# Patient Record
Sex: Male | Born: 1935 | Race: White | Hispanic: No | Marital: Married | Smoking: Never smoker
Health system: Southern US, Community
[De-identification: ages and names within clinical notes are randomized; demographics above are authoritative.]

## PROBLEM LIST (undated history)

## (undated) DIAGNOSIS — N189 Chronic kidney disease, unspecified: Secondary | ICD-10-CM

## (undated) DIAGNOSIS — M109 Gout, unspecified: Secondary | ICD-10-CM

## (undated) DIAGNOSIS — E039 Hypothyroidism, unspecified: Secondary | ICD-10-CM

## (undated) DIAGNOSIS — I10 Essential (primary) hypertension: Secondary | ICD-10-CM

## (undated) DIAGNOSIS — N400 Enlarged prostate without lower urinary tract symptoms: Secondary | ICD-10-CM

## (undated) HISTORY — PX: APPENDECTOMY: SHX54

## (undated) HISTORY — DX: Benign prostatic hyperplasia without lower urinary tract symptoms: N40.0

## (undated) HISTORY — DX: Chronic kidney disease, unspecified: N18.9

---

## 2001-07-15 ENCOUNTER — Ambulatory Visit (HOSPITAL_COMMUNITY): Admission: RE | Admit: 2001-07-15 | Discharge: 2001-07-15 | Payer: Self-pay | Admitting: Internal Medicine

## 2004-03-19 ENCOUNTER — Ambulatory Visit: Payer: Self-pay | Admitting: Family Medicine

## 2004-08-02 ENCOUNTER — Ambulatory Visit: Payer: Self-pay | Admitting: Internal Medicine

## 2004-08-02 ENCOUNTER — Ambulatory Visit (HOSPITAL_COMMUNITY): Admission: RE | Admit: 2004-08-02 | Discharge: 2004-08-02 | Payer: Self-pay | Admitting: Internal Medicine

## 2004-11-28 ENCOUNTER — Encounter: Admission: RE | Admit: 2004-11-28 | Discharge: 2004-11-28 | Payer: Self-pay | Admitting: Nephrology

## 2005-01-08 ENCOUNTER — Ambulatory Visit: Payer: Self-pay | Admitting: Family Medicine

## 2005-02-07 ENCOUNTER — Ambulatory Visit: Payer: Self-pay | Admitting: Family Medicine

## 2005-03-13 ENCOUNTER — Ambulatory Visit: Payer: Self-pay | Admitting: Family Medicine

## 2005-07-02 ENCOUNTER — Ambulatory Visit: Payer: Self-pay | Admitting: Family Medicine

## 2005-07-11 ENCOUNTER — Ambulatory Visit: Payer: Self-pay | Admitting: Family Medicine

## 2005-08-05 ENCOUNTER — Ambulatory Visit: Payer: Self-pay | Admitting: Family Medicine

## 2005-11-21 ENCOUNTER — Ambulatory Visit: Payer: Self-pay | Admitting: Family Medicine

## 2006-01-20 ENCOUNTER — Ambulatory Visit: Payer: Self-pay | Admitting: Internal Medicine

## 2006-01-27 ENCOUNTER — Ambulatory Visit: Payer: Self-pay | Admitting: Internal Medicine

## 2006-01-27 ENCOUNTER — Ambulatory Visit (HOSPITAL_COMMUNITY): Admission: RE | Admit: 2006-01-27 | Discharge: 2006-01-27 | Payer: Self-pay | Admitting: Internal Medicine

## 2006-02-14 ENCOUNTER — Ambulatory Visit: Payer: Self-pay | Admitting: Family Medicine

## 2006-05-15 ENCOUNTER — Ambulatory Visit: Payer: Self-pay | Admitting: Family Medicine

## 2006-08-11 ENCOUNTER — Ambulatory Visit: Payer: Self-pay | Admitting: Family Medicine

## 2009-10-09 DIAGNOSIS — M109 Gout, unspecified: Secondary | ICD-10-CM

## 2009-10-09 DIAGNOSIS — I10 Essential (primary) hypertension: Secondary | ICD-10-CM | POA: Insufficient documentation

## 2009-10-10 ENCOUNTER — Ambulatory Visit: Payer: Self-pay | Admitting: Gastroenterology

## 2009-10-10 ENCOUNTER — Encounter: Payer: Self-pay | Admitting: Internal Medicine

## 2009-10-10 DIAGNOSIS — K649 Unspecified hemorrhoids: Secondary | ICD-10-CM | POA: Insufficient documentation

## 2009-10-10 DIAGNOSIS — Z860100 Personal history of colon polyps, unspecified: Secondary | ICD-10-CM | POA: Insufficient documentation

## 2009-10-25 ENCOUNTER — Ambulatory Visit: Payer: Self-pay | Admitting: Internal Medicine

## 2009-10-25 ENCOUNTER — Ambulatory Visit (HOSPITAL_COMMUNITY): Admission: RE | Admit: 2009-10-25 | Discharge: 2009-10-25 | Payer: Self-pay | Admitting: Internal Medicine

## 2009-10-29 ENCOUNTER — Encounter: Payer: Self-pay | Admitting: Internal Medicine

## 2010-05-22 NOTE — Letter (Signed)
Summary: Patient Notice, Colon Biopsy Results  [facility]  [address]   [place], [place] [address]   Phone: [phone]  Fax: [phone]       October 29, 2009   Hayden Davis [address] [place], [place]  [address] [birth year]    Dear [name],  I am pleased to inform you that the biopsies taken during your recent colonoscopy did not show any evidence of cancer upon pathologic examination.  Additional information/recommendations:  You should have one more colonoscopy examination  in 3 years.  Please call us if you are having persistent problems or have questions about your condition that have not been fully answered at this time.  Sincerely,    R. Roetta Sessions MD, FACP [facility] Ph: [phone]    Fax: [phone]   Appended Document: Patient Notice, Colon Biopsy Results Letter mailed to pt.  Appended Document: Patient Notice, Colon Biopsy Results reminder appt made- cdg

## 2010-05-22 NOTE — Assessment & Plan Note (Signed)
Summary: CONSULT FOR TCS,HEMORRHOIDS/SS   Visit Type:  recall for procedure Primary Care Provider:  [place]   Chief Complaint:  hemorrhoids, h/o polyps, and fh crc.  History of Present Illness: Mr. Hayden Davis is here to schedule surveillance colonoscopy. He has personal h/o tubular adenomas, FH CRC in brother. He is doing well. He does have hemorrhoids that he pushes back in after every BM. Denies associated pain, itching, bleeding. No melena, brbpr. No constipation, diarrhea, abd pain, n/v, heartburn, dysphagia, weight loss.  Current Medications (verified): 1)  Lisinopril 40 Mg Tabs (Lisinopril) .... Once Daily 2)  Colcrys 0.6 Mg Tabs (Colchicine) .... One Tablet Weekly 3)  Allopurinol 300 Mg Tabs (Allopurinol) .... One Tablet Daily 4)  Synthroid .... One Tablet Daily  Allergies (verified): No Known Drug Allergies  Past History:  Past Medical History: EGD, 10/07-->patulous EGJ, small hiatal hernia TCS, 4/06--> left-sided and transverse diverticula TCS, 2003/2000-->tubular adenomas Gout Hypertension Hypothyroidism  Past Surgical History: Reviewed history from 10/09/2009 and no changes required. Appendectomy 1954  Family History: Brother, colon cancer, diagnosed early 56s, died due to melanoma Brother, lung cancer Sister, cancer Mother, deceased, age 86, brain tumor Father deceased, age 58, MI  Social History: Married. Daughter. Retired from post [employer]. Owns and manages rental property. No tob, drugs. No regular alcohol use.   Review of Systems General:  Denies fever, chills, sweats, anorexia, fatigue, weakness, and weight loss. Eyes:  Denies vision loss. ENT:  Denies nasal congestion, sore throat, hoarseness, and difficulty swallowing. CV:  Denies chest pains, angina, palpitations, dyspnea on exertion, and peripheral edema. Resp:  Denies dyspnea at rest, dyspnea with exercise, cough, and sputum. GI:  See HPI. GU:  Denies urinary burning and blood in urine. MS:  Denies  joint pain / LOM. Derm:  Denies rash and itching. Neuro:  Denies weakness, frequent headaches, memory loss, and confusion. Psych:  Denies depression and anxiety. Endo:  Denies unusual weight change. Heme:  Denies bruising and bleeding. Allergy:  Denies hives and rash.  Vital Signs:  Patient profile:   77 year old male Height:      71 inches Weight:      162 pounds BMI:     22.68 Temp:     98.0 degrees F oral Pulse rate:   72 / minute BP sitting:   142 / 82  (left arm) Cuff size:   large  Vitals Entered By: Cloria Spring LPN (October 10, 2009 9:27 AM)  Physical Exam  General:  Well developed, well nourished, no acute distress. Head:  Normocephalic and atraumatic. Eyes:  Conjunctivae pink, no scleral icterus.  Mouth:  Oropharyngeal mucosa moist, pink.  No lesions, erythema or exudate.    Neck:  Supple; no masses or thyromegaly. Lungs:  Clear throughout to auscultation. Heart:  Regular rate and rhythm; no murmurs, rubs,  or bruits. Abdomen:  Bowel sounds normal.  Abdomen is soft, nontender, nondistended.  No rebound or guarding.  No hepatosplenomegaly, masses or hernias.  No abdominal bruits.  Rectal:  deferred until time of colonoscopy.   Extremities:  No clubbing, cyanosis, edema or deformities noted. Neurologic:  Alert and  oriented x4;  grossly normal neurologically. Skin:  Intact without significant lesions or rashes. Cervical Nodes:  No significant cervical adenopathy. Psych:  Alert and cooperative. Normal mood and affect.  Impression & Recommendations:  Problem # 1:  COLONIC POLYPS, ADENOMATOUS, HX OF (ICD-V12.72)  Personal h/o tubular adenomas, FH CRC in sibling due to surveillance TCS. He has hemorrhoids that are not  causing him any significant problems. Colonoscopy to be performed in near future.  Risks, alternatives, and benefits including but not limited to the risk of reaction to medication, bleeding, infection, and perforation were addressed.  Patient voiced  understanding and provided verbal consent. Patient request to use same prep he used before, KWIKprep.  Orders: New Patient Level III [phone])

## 2010-05-22 NOTE — Letter (Signed)
Summary: TCS ORDER  TCS ORDER   Imported By: Ave Filter 10/10/2009 10:35:59  _____________________________________________________________________  External Attachment:    Type:   Image     Comment:   External Document

## 2010-07-09 ENCOUNTER — Other Ambulatory Visit: Payer: Self-pay | Admitting: Dermatology

## 2010-09-07 NOTE — H&P (Signed)
Hayden Davis, Hayden Davis                ACCOUNT NO.:  [id number]   MEDICAL RECORD NO.:  [id number]          PATIENT TYPE:  AMB   LOCATION:  DAY                           FACILITY:  [facility]   PHYSICIAN:  R. Roetta Sessions, M.D. DATE OF BIRTH:  January 12, 1921   DATE OF ADMISSION:  DATE OF DISCHARGE:  LH                                HISTORY & PHYSICAL   REASON:  EGD   HISTORY OF PRESENT ILLNESS:  Hayden Davis is an 77 year old Caucasian male  patient of Dr. Lysbeth Galas.  He was recently found to be hemoccult positive on  one out of three stool cards through Dr. Joyce Copa office.  He has a personal  history of adenomatous polyps.  However, the last colonoscopy was 18 months  ago by Dr. Jena Gauss.  He had left-sided and transverse diverticula and  otherwise normal exam.  He has a family history of colon cancer as well.  He  denies any GI complaints at this time.  He is taking colchicine for gout for  the last year.  He denies any anorexia, early satiety, dysphagia,  odynophagia, nausea, vomiting, heartburn, indigestion, constipation, or  diarrhea.  Denies any blood in his stools or melena.   PAST MEDICAL HISTORY:  1. Hypertension.  2. Gout.  3. Appendectomy.  4. Colonoscopy as described in the HPI was performed on August 02, 2004, by      Dr. Jena Gauss.   CURRENT MEDICATIONS:  1. Lisinopril 40 mg daily.  2. Colchicine 0.6 mg daily.   ALLERGIES:  No known drug allergies.   FAMILY HISTORY:  Positive for a brother diagnosed with colon cancer in his  early 67s disease secondary to melanoma.  Mother deceased at age 25  secondary to brain tumor.  Father deceased at age 31 secondary to an MI.   SOCIAL HISTORY:  Hayden Davis is married.  He has one grown healthy daughter.  He is retired from the post office.  He denies any tobacco, alcohol or drug  use.   REVIEW OF SYSTEMS:  CONSTITUTIONAL:  Denies any weight changes.  Denies any  fever or chills.  CARDIOVASCULAR:  No angina, chest pain, palpitation.  RESPIRATORY:  Denied any shortness of breath, dyspnea, cough, hemoptysis.  GI:  See HPI.   PHYSICAL EXAMINATION:  VITAL SIGNS:  Weight 167 pounds, height 71 inches.  Temperature 98.2, blood pressure 142/60 and pulse 60.  GENERAL:  Hayden Davis is an 77 year old Caucasian male who is alert and  oriented, pleasant and cooperative in no acute distress.  HEENT:  Sclerae clear, nonicteric.  Conjunctivae pink.  Oropharynx pink and  moist without any lesions.  NECK:  Supple without any thyromegaly.  CHEST:  Heart regular rate and rhythm, normal S1 and S2 without any murmurs,  clicks, rubs or gallops.  LUNGS:  Clear to auscultation bilaterally.  ABDOMEN:  Positive bowel sounds x4, no bruits auscultated.  Soft, nontender,  nondistended without palpable mass or hepatosplenomegaly.  No rebound  tenderness or guarding.  EXTREMITIES:  Without clubbing or edema.  SKIN:  Pink, warm and dry without any rash or jaundice.  IMPRESSION:  Hayden Davis is an 77 year old Caucasian male who was found to  be hemoccult positive.  I have discussed this further with Dr. Jena Gauss, given  the fact that he has had a colonoscopy 18 months ago.  He also has a  personal history of polyps and a family history of colorectal carcinoma.  We  have decided to proceed with EGD to rule out occult upper GI lesion or  silent peptic ulcer disease and proceed with a workup there.  Not mentioned  above, he previously had a normal hemoglobin on December 25, 2005, of 14.2  with hematocrit of 43.1.  He denies any GI complaints at this time.   PLAN:  1. EGD with Dr. Jena Gauss in the near future.  I have discussed the procedure      including the risks and benefits which include but are not limited to      bleeding, infection, perforation, drug reaction.  He agrees with the      plan and consent will be obtained.  2. Further recommendations pending EGD.      Nicholas Lose, N.P.      Jonathon Bellows, M.D.  Electronically  Signed    KC/MEDQ  D:  01/20/2006  T:  01/21/2006  Job:  [id number]   cc:   Delaney Meigs, M.D.  Fax: [phone]

## 2010-09-07 NOTE — Op Note (Signed)
NAMEHARBERT, FITTERER                ACCOUNT NO.:  [id number]   MEDICAL RECORD NO.:  [id number]          PATIENT TYPE:  AMB   LOCATION:  DAY                           FACILITY:  [facility]   PHYSICIAN:  R. Roetta Sessions, M.D. DATE OF BIRTH:  July 06, 1920   DATE OF PROCEDURE:  01/27/2006  DATE OF DISCHARGE:                                 OPERATIVE REPORT   PROCEDURE:  Diagnostic esophagogastroduodenoscopy.   INDICATIONS FOR PROCEDURE:  The patient is an 77 year old gentleman with no  GI symptoms, positive family history of colon cancer, personal history of  colon polyps, had a colonoscopy just 18 months ago, he had left sided  transverse diverticula, otherwise, exam was normal.  Recently, 1 out of 3  hemoccult cards came back positive.  He has not had any melena or rectal  bleeding.  No abdominal pain, no dysphagia, no odynophagia, reflux symptoms,  no change in weight.  It is notable this gentleman tells me he has had  active gum disease for which he is being followed closely by a dentist.  He  has on the order of 2-3 episodes of significant gum bleeding with teeth  brushing weekly and he may be swallowing blood.  EGD is now being done to  rule out a process in the upper GI tract which may be contributing to heme  positive stool.  This approach has been discussed with the patient at  length, the potential risks, benefits, and alternatives have been reviewed  and questions answered.  Please see documentation in the medical record.   PROCEDURE NOTE:  O2 saturation, blood pressure, and pulse were monitored  throughout the entire procedure.  Conscious sedation was achieved with  Versed 3 mg IV and Demerol 75 mg IV in divided doses.   INSTRUMENT USED:  Olympus videochip system.   FINDINGS:  Examination of the tubular esophagus revealed somewhat of a  patulous EG junction, the esophagus, otherwise, appeared normal.  The EG  junction was easily traversed.  Stomach:  The gastric cavity was  emptied and insufflated with air.  Thorough  examination of the gastric mucosa including retroflex view of the proximal  stomach and esophagogastric junction demonstrated a small hiatal hernia.  The pylorus was patent and easily traversed.  Examination of the bulb and  second portion revealed normal abnormalities.   THERAPY/DIAGNOSTIC MANEUVERS PERFORMED:  None.   The patient tolerated the procedure well and was reactivated in endoscopy.   IMPRESSION:  Patulous EG junction, small hiatal hernia, otherwise, normal  upper GI tract.  I suspect the patient may well be hemoccult positive on the  basis of swallowed blood related to gum disease.   RECOMMENDATIONS:  At this point in time, I do not see any reason to move up  his next scheduled colonoscopy.  I would like to do a CBC in six weeks to  see where we stand and go from there.      Jonathon Bellows, M.D.  Electronically Signed     RMR/MEDQ  D:  01/27/2006  T:  01/28/2006  Job:  [id number]   cc:  Delaney Meigs, M.D.  Fax: [phone]

## 2010-09-07 NOTE — Op Note (Signed)
Hayden Davis, Hayden Davis                ACCOUNT NO.:  [id number]   MEDICAL RECORD NO.:  [id number]          PATIENT TYPE:  AMB   LOCATION:  DAY                           FACILITY:  [facility]   PHYSICIAN:  R. Roetta Sessions, M.D. DATE OF BIRTH:  03-Jan-1921   DATE OF PROCEDURE:  08/02/2004  DATE OF DISCHARGE:                                 OPERATIVE REPORT   PROCEDURE:  Colonoscopy.   The patient has a history of colonic polyps, a positive family history of  colorectal cancer.  Last colonoscopy was in 2003.  Polyps were reportedly  removed.  He has no lower GI tract symptoms.  Colonoscopy is now being done  as a surveillance maneuver.  This approach has been discussed with the  patient at length, the potential risks, benefits, and alternatives have been  reviewed, questions answered.  He is agreeable.  Please see documentation in  the medical record.   PROCEDURE NOTE:  O2 saturation, blood pressure, pulse, and respiration were  monitored throughout the entire procedure.   CONSCIOUS SEDATION:  Versed 4 mg IV, Demerol 50 mg IV in divided doses.   INSTRUMENT USED:  Olympus video chip system.   FINDINGS:  Digital rectal examination revealed no abnormalities.  Endoscopic  findings:  The prep was adequate.   Rectum:  Examination of the rectal mucosa including retroflexed view of the  anal verge revealed no abnormalities.   Colon:  The colonic mucosa was surveyed from the rectosigmoid junction  through the left, transverse and right colon to the area of the appendiceal  orifice, ileocecal valve and cecum.  These structures were well-seen and  photographed for the record.  From this level the scope was slowly  withdrawn.  All previously-mentioned mucosal surfaces were again seen.  The  patient was noted to have left-sided and transverse diverticula.  The  remainder of the colonic mucosa appeared normal.  The patient tolerated the  procedure well, was reacted in endoscopy.   IMPRESSION:  1.   Normal rectum.  2.  Left-sided and transverse diverticula.  The remainder of colonic mucosa      appeared normal.   RECOMMENDATIONS:  1.  Repeat colonoscopy in five years.  2.  Diverticulosis literature provided to Mr. Banko.      RMR/MEDQ  D:  08/02/2004  T:  08/02/2004  Job:  [id number]

## 2010-09-07 NOTE — Op Note (Signed)
[facility]  Patient:    Hayden Davis, Hayden Davis Visit Number: [id number] MRN: [id number]          Service Type: DSU Location: DAY Attending Physician:  Jonathon Bellows Dictated by:   Roetta Sessions, M.D. Proc. Date: 07/15/01 Admit Date:  07/15/2001   CC:         Dr. Dimple Casey, [facility]   Operative Report  INDICATIONS FOR PROCEDURE:  The patient is a 77 year old with currently no GI symptoms who underwent colonoscopy three years ago and was found to have an adenomatous polyp, which was removed.  Colonoscopy is now being done as a surveillance maneuver, and this approach has been discussed with the patient. He understands the potential risks, benefits, and alternatives and questions answered.  He is agreeable.  Please see the handwritten H&P for more information.  PROCEDURE NOTE:  O2 saturation, blood pressure, pulse, and respirations were monitored throughout the entirety of the procedure.  He was bradycardic at the outset of the procedure, for which he was given atropine 0.5 mg IV.  CONSCIOUS SEDATION:  Versed 4 mg in divided doses, Demerol 75 mg IV in divided doses.  INSTRUMENT:  Olympus video chip colonoscope.  FINDINGS:  Digital rectal exam revealed no abnormalities.  ENDOSCOPIC FINDINGS:  The prep was good.  RECTUM: Examination of the rectal mucosa including retroflexed view at the anal verge revealed no abnormalities.  COLON: The colonic mucosa was surveyed from the rectosigmoid junction through the left transverse and right colon to the area of the appendiceal orifice, the ileocecal valve, and cecum.  The patient was noted to have left-sided diverticula.  There were two 5 mm polyps, one at the hepatic flexure and one in the midascending colon which were cold biopsied/removed.  The cecum, ileocecal valve, and appendiceal orifice were well-seen and photographed for the record.  From this level, the scope was slowly  withdrawn, and all previously mentioned mucosal surfaces were again seen.  No other abnormalities were observed.  The patient tolerated the procedure well and was reacted in endoscopy.  IMPRESSION: 1. Normal rectum. 2. Left-sided diverticula.  Small polyps in the right colon cold biopsied/    removed as described above.  The remainder of the colonic mucosa appeared    normal.  RECOMMENDATIONS: 1. Diverticulosis literature given to [name]. 2. Followup on pathology. 3. Further recommendations to follow. Dictated by:   Roetta Sessions, M.D. Attending Physician:  Jonathon Bellows DD:  07/15/01 TD:  07/16/01 Job: [id number]

## 2011-03-28 ENCOUNTER — Other Ambulatory Visit: Payer: Self-pay | Admitting: Dermatology

## 2013-04-21 ENCOUNTER — Telehealth: Payer: Self-pay

## 2013-04-21 NOTE — Telephone Encounter (Signed)
Pt called today because he received a letter need to schedule a  TCS but he stated that he was 79 years old and did not think he needed one.

## 2013-04-22 NOTE — Telephone Encounter (Signed)
Agree, he does not need one unless he has symptoms

## 2013-09-29 ENCOUNTER — Other Ambulatory Visit: Payer: Self-pay | Admitting: Dermatology

## 2013-10-08 DIAGNOSIS — E039 Hypothyroidism, unspecified: Secondary | ICD-10-CM | POA: Insufficient documentation

## 2014-07-22 ENCOUNTER — Emergency Department (HOSPITAL_COMMUNITY): Payer: Medicare Other

## 2014-07-22 ENCOUNTER — Encounter (HOSPITAL_COMMUNITY): Payer: Self-pay

## 2014-07-22 ENCOUNTER — Emergency Department (HOSPITAL_COMMUNITY)
Admission: EM | Admit: 2014-07-22 | Discharge: 2014-07-23 | Disposition: A | Discharge status: Home / Self Care | Payer: Medicare Other | Attending: Emergency Medicine | Admitting: Emergency Medicine

## 2014-07-22 DIAGNOSIS — S0101XA Laceration without foreign body of scalp, initial encounter: Secondary | ICD-10-CM | POA: Insufficient documentation

## 2014-07-22 DIAGNOSIS — Y998 Other external cause status: Secondary | ICD-10-CM | POA: Insufficient documentation

## 2014-07-22 DIAGNOSIS — W01198A Fall on same level from slipping, tripping and stumbling with subsequent striking against other object, initial encounter: Secondary | ICD-10-CM | POA: Diagnosis not present

## 2014-07-22 DIAGNOSIS — S0990XA Unspecified injury of head, initial encounter: Secondary | ICD-10-CM | POA: Diagnosis present

## 2014-07-22 DIAGNOSIS — Z23 Encounter for immunization: Secondary | ICD-10-CM | POA: Diagnosis not present

## 2014-07-22 DIAGNOSIS — Z8739 Personal history of other diseases of the musculoskeletal system and connective tissue: Secondary | ICD-10-CM | POA: Insufficient documentation

## 2014-07-22 DIAGNOSIS — Z8639 Personal history of other endocrine, nutritional and metabolic disease: Secondary | ICD-10-CM | POA: Insufficient documentation

## 2014-07-22 DIAGNOSIS — I10 Essential (primary) hypertension: Secondary | ICD-10-CM | POA: Diagnosis not present

## 2014-07-22 DIAGNOSIS — Y9389 Activity, other specified: Secondary | ICD-10-CM | POA: Insufficient documentation

## 2014-07-22 DIAGNOSIS — Y9289 Other specified places as the place of occurrence of the external cause: Secondary | ICD-10-CM | POA: Insufficient documentation

## 2014-07-22 HISTORY — DX: Gout, unspecified: M10.9

## 2014-07-22 HISTORY — DX: Essential (primary) hypertension: I10

## 2014-07-22 HISTORY — DX: Hypothyroidism, unspecified: E03.9

## 2014-07-22 MED ORDER — LIDOCAINE-EPINEPHRINE (PF) 2 %-1:200000 IJ SOLN
INTRAMUSCULAR | Status: AC
  Administered 2014-07-22: 23:00:00
  Filled 2014-07-22: qty 20

## 2014-07-22 MED ORDER — POVIDONE-IODINE 10 % EX SOLN
CUTANEOUS | Status: AC
  Filled 2014-07-22: qty 118

## 2014-07-22 MED ORDER — HYDROMORPHONE HCL 1 MG/ML IJ SOLN
0.5000 mg | Freq: Once | INTRAMUSCULAR | Status: AC
  Administered 2014-07-22: 0.5 mg via INTRAMUSCULAR
  Filled 2014-07-22: qty 1

## 2014-07-22 MED ORDER — TETANUS-DIPHTH-ACELL PERTUSSIS 5-2.5-18.5 LF-MCG/0.5 IM SUSP: 0.5000 mL | Freq: Once | INTRAMUSCULAR | Status: DC

## 2014-07-22 NOTE — ED Notes (Signed)
Pt tripped over a doorway threshold and fell, hit his head on door frame.  Per ems, pt has a large hematoma with avulsion to right scalp area, bleeding controlled with dressing in place

## 2014-07-22 NOTE — Discharge Instructions (Signed)
Clean cut twice a day gently with soap and water.  Sutures out in 1 week

## 2014-07-22 NOTE — ED Provider Notes (Signed)
CSN: [id number]     Arrival date & time 07/22/14  2130 History  This chart was scribed for Milton Ferguson, MD by Eustaquio Maize, ED Scribe. This patient was seen in room APA16A/APA16A and the patient's care was started at 9:53 PM.    Chief Complaint  Patient presents with  . Fall  . Head Injury   Patient is a 81 y.o. male presenting with fall and head injury. The history is provided by the patient. No language interpreter was used.  Fall This is a new problem. The problem occurs rarely. Pertinent negatives include no chest pain, no abdominal pain and no headaches.  Head Injury Location:  L parietal Mechanism of injury: fall   Pain details:    Quality:  Unable to specify   Severity:  Unable to specify   Timing:  Constant Associated symptoms: no headaches, no loss of consciousness and no seizures   Risk factors: aspirin      HPI Comments: Hayden Davis is a 81 y.o. male brought in by ambulance, who presents to the Emergency Department complaining of head injury s/p ground level fall that occurred tonight. Pt states that he tripped over a doorway threshold and fell, hitting his head on the door frame. Pt denies LOC. Pt is currently on daily aspirin but denies any other anticoagulants.    Past Medical History  Diagnosis Date  . Hypertension   . Gout   . Hypothyroid    History reviewed. No pertinent past surgical history. No family history on file. History  Substance Use Topics  . Smoking status: Never Smoker   . Smokeless tobacco: Not on file  . Alcohol Use: Yes    Review of Systems  Constitutional: Negative for appetite change and fatigue.  HENT: Negative for congestion, ear discharge and sinus pressure.        Laceration to left scalp.   Eyes: Negative for discharge.  Respiratory: Negative for cough.   Cardiovascular: Negative for chest pain.  Gastrointestinal: Negative for abdominal pain and diarrhea.  Genitourinary: Negative for frequency and hematuria.   Musculoskeletal: Negative for back pain.  Skin: Negative for rash.  Neurological: Negative for seizures, loss of consciousness and headaches.  Psychiatric/Behavioral: Negative for hallucinations.      Allergies  Review of patient's allergies indicates no known allergies.  Home Medications   Prior to Admission medications   Not on File   Triage Vitals: BP 180/98 mmHg  Pulse 98  Temp(Src) 98 F (36.7 C) (Oral)  Resp 20  Ht 5\' 11"  (1.803 m)  Wt 165 lb (74.844 kg)  BMI 23.02 kg/m2  SpO2 99%   Physical Exam  Constitutional: He is oriented to person, place, and time. He appears well-developed and well-nourished.  HENT:  Head: Normocephalic.  9 cm superficial laceration to the left scalp with hematoma.   Eyes: Conjunctivae and EOM are normal. No scleral icterus.  Neck: Neck supple. No thyromegaly present.  Cardiovascular: Normal rate, regular rhythm and normal heart sounds.  Exam reveals no gallop and no friction rub.   No murmur heard. Pulmonary/Chest: Effort normal and breath sounds normal. No stridor. He has no wheezes. He has no rales. He exhibits no tenderness.  Abdominal: He exhibits no distension. There is no tenderness. There is no rebound.  Musculoskeletal: Normal range of motion. He exhibits no edema.  Lymphadenopathy:    He has no cervical adenopathy.  Neurological: He is oriented to person, place, and time. He exhibits normal muscle tone. Coordination normal.  Skin: No rash noted. No erythema.  Psychiatric: He has a normal mood and affect. His behavior is normal.    ED Course  Procedures (including critical care time)  Procedure Note - 9 cm laceration to left scalp. Cleaned thoroughly with Betadine. 14 staples used. Pt tolerated procedure well.   Pt needed 3,  3-0 sutures also  DIAGNOSTIC STUDIES: Oxygen Saturation is 99% on RA, normal by my interpretation.    COORDINATION OF CARE: 9:54 PM-Discussed treatment plan which includes staples with pt at bedside  and pt agreed to plan.   Labs Review Labs Reviewed - No data to display  Imaging Review No results found.   EKG Interpretation None      MDM   Final diagnoses:  None    Head laceration   The chart was scribed for me under my direct supervision.  I personally performed the history, physical, and medical decision making and all procedures in the evaluation of this patient.Milton Ferguson, MD 07/22/14 [id number]

## 2014-10-17 ENCOUNTER — Other Ambulatory Visit: Payer: Self-pay

## 2015-04-29 ENCOUNTER — Emergency Department (HOSPITAL_COMMUNITY)
Admission: EM | Admit: 2015-04-29 | Discharge: 2015-04-29 | Disposition: A | Discharge status: Home / Self Care | Payer: Medicare Other | Attending: Emergency Medicine | Admitting: Emergency Medicine

## 2015-04-29 ENCOUNTER — Encounter (HOSPITAL_COMMUNITY): Payer: Self-pay

## 2015-04-29 ENCOUNTER — Emergency Department (HOSPITAL_COMMUNITY): Payer: Medicare Other

## 2015-04-29 DIAGNOSIS — J069 Acute upper respiratory infection, unspecified: Secondary | ICD-10-CM | POA: Diagnosis not present

## 2015-04-29 DIAGNOSIS — I10 Essential (primary) hypertension: Secondary | ICD-10-CM | POA: Diagnosis not present

## 2015-04-29 DIAGNOSIS — M109 Gout, unspecified: Secondary | ICD-10-CM | POA: Diagnosis not present

## 2015-04-29 DIAGNOSIS — J209 Acute bronchitis, unspecified: Secondary | ICD-10-CM | POA: Diagnosis not present

## 2015-04-29 DIAGNOSIS — R05 Cough: Secondary | ICD-10-CM | POA: Diagnosis present

## 2015-04-29 DIAGNOSIS — Z79899 Other long term (current) drug therapy: Secondary | ICD-10-CM | POA: Insufficient documentation

## 2015-04-29 DIAGNOSIS — E039 Hypothyroidism, unspecified: Secondary | ICD-10-CM | POA: Insufficient documentation

## 2015-04-29 LAB — BASIC METABOLIC PANEL
Anion gap: 8 (ref 5–15)
BUN: 26 mg/dL — AB (ref 6–20)
CALCIUM: 9 mg/dL (ref 8.9–10.3)
CHLORIDE: 104 mmol/L (ref 101–111)
CO2: 27 mmol/L (ref 22–32)
CREATININE: 1.45 mg/dL — AB (ref 0.61–1.24)
GFR calc non Af Amer: 40 mL/min — ABNORMAL LOW (ref >60)
GFR, EST AFRICAN AMERICAN: 47 mL/min — AB (ref >60)
GLUCOSE: 122 mg/dL — AB (ref 65–99)
Potassium: 4.1 mmol/L (ref 3.5–5.1)
Sodium: 139 mmol/L (ref 135–145)

## 2015-04-29 LAB — CBC
HEMATOCRIT: 43 % (ref 39.0–52.0)
Hemoglobin: 14 g/dL (ref 13.0–17.0)
MCH: 32 pg (ref 26.0–34.0)
MCHC: 32.6 g/dL (ref 30.0–36.0)
MCV: 98.2 fL (ref 78.0–100.0)
Platelets: 125 10*3/uL — ABNORMAL LOW (ref 150–400)
RBC: 4.38 MIL/uL (ref 4.22–5.81)
RDW: 13.5 % (ref 11.5–15.5)
WBC: 8.6 10*3/uL (ref 4.0–10.5)

## 2015-04-29 MED ORDER — ACETAMINOPHEN 325 MG PO TABS
650.0000 mg | ORAL_TABLET | Freq: Once | ORAL | Status: AC
  Administered 2015-04-29: 650 mg via ORAL
  Filled 2015-04-29: qty 2

## 2015-04-29 MED ORDER — IPRATROPIUM-ALBUTEROL 0.5-2.5 (3) MG/3ML IN SOLN
3.0000 mL | Freq: Once | RESPIRATORY_TRACT | Status: AC
  Administered 2015-04-29: 3 mL via RESPIRATORY_TRACT
  Filled 2015-04-29: qty 3

## 2015-04-29 MED ORDER — AEROCHAMBER Z-STAT PLUS/MEDIUM MISC: 1.0000 | Freq: Once | Status: DC

## 2015-04-29 MED ORDER — ALBUTEROL SULFATE HFA 108 (90 BASE) MCG/ACT IN AERS
2.0000 | INHALATION_SPRAY | RESPIRATORY_TRACT | Status: DC | PRN
  Administered 2015-04-29: 2 via RESPIRATORY_TRACT
  Filled 2015-04-29: qty 6.7

## 2015-04-29 NOTE — ED Provider Notes (Signed)
CSN: [id number]     Arrival date & time 04/29/15  1417 History   First MD Initiated Contact with Patient 04/29/15 1421     Chief Complaint  Patient presents with  . Cough     (Consider location/radiation/quality/duration/timing/severity/associated sxs/prior Treatment) HPI   Hayden Davis is a 82 y.o. male who presents for evaluation of cough, congestion, which started last night. He is producing green sputum with cough. His mild shortness of breath, which is not worse when in the supine position. He difficulty sleeping last night because of coughing. He denies fever, chills, nausea, vomiting, change in chronic dizziness, or pain in head, neck, chest or back. There are no other known modifying factors.   Past Medical History  Diagnosis Date  . Hypertension   . Gout   . Hypothyroid    Past Surgical History  Procedure Laterality Date  . Appendectomy     No family history on file. Social History  Substance Use Topics  . Smoking status: Never Smoker   . Smokeless tobacco: None  . Alcohol Use: Yes     Comment: occ    Review of Systems  All other systems reviewed and are negative.     Allergies  Review of patient's allergies indicates no known allergies.  Home Medications   Prior to Admission medications   Medication Sig Start Date End Date Taking? Authorizing Provider  allopurinol (ZYLOPRIM) 300 MG tablet Take 300 mg by mouth daily. 06/07/14  Yes Historical Provider, MD  Dextromethorphan-Guaifenesin ([place] FAST-MAX DM MAX) 5-100 MG/5ML LIQD Take 10 mLs by mouth daily as needed (for congestion).   Yes Historical Provider, MD  guaiFENesin (ROBITUSSIN) 100 MG/5ML SOLN Take 10 mLs by mouth every 4 (four) hours as needed for cough or to loosen phlegm.   Yes Historical Provider, MD  levothyroxine (SYNTHROID, LEVOTHROID) 25 MCG tablet Take 25 mcg by mouth daily before breakfast.    Yes Historical Provider, MD   BP 151/63 mmHg  Pulse 103  Temp(Src) 99 F (37.2 C) (Oral)   Resp 17  Ht 5\' 11"  (1.803 m)  Wt 162 lb (73.483 kg)  BMI 22.60 kg/m2  SpO2 97% Physical Exam  Constitutional: He is oriented to person, place, and time. He appears well-developed and well-nourished.  HENT:  Head: Normocephalic and atraumatic.  Right Ear: External ear normal.  Left Ear: External ear normal.  Eyes: Conjunctivae and EOM are normal. Pupils are equal, round, and reactive to light.  Neck: Normal range of motion and phonation normal. Neck supple.  Cardiovascular: Normal rate, regular rhythm and normal heart sounds.   Pulmonary/Chest: Effort normal and breath sounds normal. No respiratory distress. He has no wheezes. He has no rales. He exhibits no tenderness and no bony tenderness.  Abdominal: Soft. There is no tenderness.  Musculoskeletal: Normal range of motion. He exhibits no edema or tenderness.  Neurological: He is alert and oriented to person, place, and time. No cranial nerve deficit or sensory deficit. He exhibits normal muscle tone. Coordination normal.  Skin: Skin is warm, dry and intact.  Psychiatric: He has a normal mood and affect. His behavior is normal. Judgment and thought content normal.  Nursing note and vitals reviewed.   ED Course  Procedures (including critical care time)  Medications  albuterol (PROVENTIL HFA;VENTOLIN HFA) 108 (90 Base) MCG/ACT inhaler 2 puff (not administered)  aerochamber Z-Stat Plus/medium 1 each (not administered)  ipratropium-albuterol (DUONEB) 0.5-2.5 (3) MG/3ML nebulizer solution 3 mL (3 mLs Nebulization Given 04/29/15 1511)  acetaminophen (  TYLENOL) tablet 650 mg (650 mg Oral Given 04/29/15 1636)    Patient Vitals for the past 24 hrs:  BP Temp Temp src Pulse Resp SpO2 Height Weight  04/29/15 1600 151/63 mmHg - - 103 17 97 % - -  04/29/15 1511 - - - - - 96 % - -  04/29/15 1424 146/72 mmHg 99 F (37.2 C) Oral 86 20 100 % 5\' 11"  (1.803 m) 162 lb (73.483 kg)    4:30 PM Reevaluation with update and discussion. After initial  assessment and treatment, an updated evaluation reveals patient is somewhat warm to touch. Lungs have improved air movement, but no audible rhonchi scattered. No wheezing at this time. Findings discussed with patient and grandson, all questions were answered. Grandson, states that he can help "keep an eye on the patient". [place] Review Labs Reviewed  CBC - Abnormal; Notable for the following:    Platelets 125 (*)    All other components within normal limits  BASIC METABOLIC PANEL - Abnormal; Notable for the following:    Glucose, Bld 122 (*)    BUN 26 (*)    Creatinine, Ser 1.45 (*)    GFR calc non Af Amer 40 (*)    GFR calc Af Amer 47 (*)    All other components within normal limits    Imaging Review Dg Chest 2 View  04/29/2015  CLINICAL DATA:  Cough and congestion EXAM: CHEST  2 VIEW COMPARISON:  None FINDINGS: Normal heart size. No pleural effusion or edema identified. No airspace consolidation identified. The visualized osseous structures are unremarkable. IMPRESSION: 1. No acute cardiopulmonary abnormalities. Electronically Signed   By: Kerby Moors M.D.   On: 04/29/2015 15:01   I have personally reviewed and evaluated these images and lab results as part of my medical decision-making.   EKG Interpretation   Date/Time:  Saturday April 29 2015 14:41:59 EST Ventricular Rate:  86 PR Interval:  182 QRS Duration: 96 QT Interval:  356 QTC Calculation: 426 R Axis:   15 Text Interpretation:  Sinus rhythm No old tracing to compare Confirmed by  [facility]  MD, Murl Golladay [id number]) on 04/29/2015 3:10:05 PM      MDM   Final diagnoses:  Acute bronchitis, unspecified organism  URI (upper respiratory infection)    Febrile illness, with bronchospasm, likely unspecified URI. Patient states that he is a patient October 2016. Doubt serous bacterial infection, pneumonia, metabolic instability or impending vascular collapse.  Nursing Notes Reviewed/ Care  Coordinated Applicable Imaging Reviewed Interpretation of Laboratory Data incorporated into ED treatment  The patient appears reasonably screened and/or stabilized for discharge and I doubt any other medical condition or other [facility] requiring further screening, evaluation, or treatment in the ED at this time prior to discharge.  Plan: Home Medications- albuterol inhaler, tylenol; Home Treatments- rest, fluids; return here if the recommended treatment, does not improve the symptoms; Recommended follow up- PCP prn     Daleen Bo, MD 04/29/15 [phone]

## 2015-04-29 NOTE — ED Notes (Signed)
Pt reports nonproductive cough and congestion since last night.  Denies fever.  Pt says chest hurts and burns with coughing.

## 2015-04-29 NOTE — ED Notes (Signed)
MD at bedside. 

## 2015-04-29 NOTE — ED Notes (Signed)
RT at bedside.

## 2015-04-29 NOTE — Discharge Instructions (Signed)
Get plenty of rest, drink a lot of fluids and eat 3 meals each day. Use Tylenol every 4 hours for fever. Use the inhaler 2 puffs every 3 or 4 hours as needed for cough or trouble breathing. Return here if your symptoms worsen.   Acute Bronchitis Bronchitis is inflammation of the airways that extend from the windpipe into the lungs (bronchi). The inflammation often causes mucus to develop. This leads to a cough, which is the most common symptom of bronchitis.  In acute bronchitis, the condition usually develops suddenly and goes away over time, usually in a couple weeks. Smoking, allergies, and asthma can make bronchitis worse. Repeated episodes of bronchitis may cause further lung problems.  CAUSES Acute bronchitis is most often caused by the same virus that causes a cold. The virus can spread from person to person (contagious) through coughing, sneezing, and touching contaminated objects. SIGNS AND SYMPTOMS   Cough.   Fever.   Coughing up mucus.   Body aches.   Chest congestion.   Chills.   Shortness of breath.   Sore throat.  DIAGNOSIS  Acute bronchitis is usually diagnosed through a physical exam. Your health care provider will also ask you questions about your medical history. Tests, such as chest X-rays, are sometimes done to rule out other conditions.  TREATMENT  Acute bronchitis usually goes away in a couple weeks. Oftentimes, no medical treatment is necessary. Medicines are sometimes given for relief of fever or cough. Antibiotic medicines are usually not needed but may be prescribed in certain situations. In some cases, an inhaler may be recommended to help reduce shortness of breath and control the cough. A cool mist vaporizer may also be used to help thin bronchial secretions and make it easier to clear the chest.  HOME CARE INSTRUCTIONS  Get plenty of rest.   Drink enough fluids to keep your urine clear or pale yellow (unless you have a medical condition that  requires fluid restriction). Increasing fluids may help thin your respiratory secretions (sputum) and reduce chest congestion, and it will prevent dehydration.   Take medicines only as directed by your health care provider.  If you were prescribed an antibiotic medicine, finish it all even if you start to feel better.  Avoid smoking and secondhand smoke. Exposure to cigarette smoke or irritating chemicals will make bronchitis worse. If you are a smoker, consider using nicotine gum or skin patches to help control withdrawal symptoms. Quitting smoking will help your lungs heal faster.   Reduce the chances of another bout of acute bronchitis by washing your hands frequently, avoiding people with cold symptoms, and trying not to touch your hands to your mouth, nose, or eyes.   Keep all follow-up visits as directed by your health care provider.  SEEK MEDICAL CARE IF: Your symptoms do not improve after 1 week of treatment.  SEEK IMMEDIATE MEDICAL CARE IF:  You develop an increased fever or chills.   You have chest pain.   You have severe shortness of breath.  You have bloody sputum.   You develop dehydration.  You faint or repeatedly feel like you are going to pass out.  You develop repeated vomiting.  You develop a severe headache. MAKE SURE YOU:   Understand these instructions.  Will watch your condition.  Will get help right away if you are not doing well or get worse.   This information is not intended to replace advice given to you by your health care provider. Make sure you  discuss any questions you have with your health care provider.   Document Released: 05/16/2004 Document Revised: 04/29/2014 Document Reviewed: 09/29/2012 Elsevier Interactive Patient Education 2016 Elsevier [employer]  Upper Respiratory Infection, Adult Most upper respiratory infections (URIs) are a viral infection of the air passages leading to the lungs. A URI affects the nose, throat, and upper air  passages. The most common type of URI is nasopharyngitis and is typically referred to as "the common cold." URIs run their course and usually go away on their own. Most of the time, a URI does not require medical attention, but sometimes a bacterial infection in the upper airways can follow a viral infection. This is called a secondary infection. Sinus and middle ear infections are common types of secondary upper respiratory infections. Bacterial pneumonia can also complicate a URI. A URI can worsen asthma and chronic obstructive pulmonary disease (COPD). Sometimes, these complications can require emergency medical care and may be life threatening.  CAUSES Almost all URIs are caused by viruses. A virus is a type of germ and can spread from one person to another.  RISKS FACTORS You may be at risk for a URI if:   You smoke.   You have chronic heart or lung disease.  You have a weakened defense (immune) system.   You are very young or very old.   You have nasal allergies or asthma.  You work in crowded or poorly ventilated areas.  You work in health care facilities or schools. SIGNS AND SYMPTOMS  Symptoms typically develop 2-3 days after you come in contact with a cold virus. Most viral URIs last 7-10 days. However, viral URIs from the influenza virus (flu virus) can last 14-18 days and are typically more severe. Symptoms may include:   Runny or stuffy (congested) nose.   Sneezing.   Cough.   Sore throat.   Headache.   Fatigue.   Fever.   Loss of appetite.   Pain in your forehead, behind your eyes, and over your cheekbones (sinus pain).  Muscle aches.  DIAGNOSIS  Your health care provider may diagnose a URI by:  Physical exam.  Tests to check that your symptoms are not due to another condition such as:  Strep throat.  Sinusitis.  Pneumonia.  Asthma. TREATMENT  A URI goes away on its own with time. It cannot be cured with medicines, but medicines may  be prescribed or recommended to relieve symptoms. Medicines may help:  Reduce your fever.  Reduce your cough.  Relieve nasal congestion. HOME CARE INSTRUCTIONS   Take medicines only as directed by your health care provider.   Gargle warm saltwater or take cough drops to comfort your throat as directed by your health care provider.  Use a warm mist humidifier or inhale steam from a shower to increase air moisture. This may make it easier to breathe.  Drink enough fluid to keep your urine clear or pale yellow.   Eat soups and other clear broths and maintain good nutrition.   Rest as needed.   Return to work when your temperature has returned to normal or as your health care provider advises. You may need to stay home longer to avoid infecting others. You can also use a face mask and careful hand washing to prevent spread of the virus.  Increase the usage of your inhaler if you have asthma.   Do not use any tobacco products, including cigarettes, chewing tobacco, or electronic cigarettes. If you need help quitting, ask your health  care provider. PREVENTION  The best way to protect yourself from getting a cold is to practice good hygiene.   Avoid oral or hand contact with people with cold symptoms.   Wash your hands often if contact occurs.  There is no clear evidence that vitamin C, vitamin E, echinacea, or exercise reduces the chance of developing a cold. However, it is always recommended to get plenty of rest, exercise, and practice good nutrition.  SEEK MEDICAL CARE IF:   You are getting worse rather than better.   Your symptoms are not controlled by medicine.   You have chills.  You have worsening shortness of breath.  You have brown or red mucus.  You have yellow or brown nasal discharge.  You have pain in your face, especially when you bend forward.  You have a fever.  You have swollen neck glands.  You have pain while swallowing.  You have white  areas in the back of your throat. SEEK IMMEDIATE MEDICAL CARE IF:   You have severe or persistent:  Headache.  Ear pain.  Sinus pain.  Chest pain.  You have chronic lung disease and any of the following:  Wheezing.  Prolonged cough.  Coughing up blood.  A change in your usual mucus.  You have a stiff neck.  You have changes in your:  Vision.  Hearing.  Thinking.  Mood. MAKE SURE YOU:   Understand these instructions.  Will watch your condition.  Will get help right away if you are not doing well or get worse.   This information is not intended to replace advice given to you by your health care provider. Make sure you discuss any questions you have with your health care provider.   Document Released: 10/02/2000 Document Revised: 08/23/2014 Document Reviewed: 07/14/2013 Elsevier Interactive Patient Education [employer].

## 2015-04-29 NOTE — ED Notes (Signed)
Lab at bedside

## 2016-11-18 ENCOUNTER — Other Ambulatory Visit: Payer: Self-pay | Admitting: Dermatology

## 2016-11-18 DIAGNOSIS — C4492 Squamous cell carcinoma of skin, unspecified: Secondary | ICD-10-CM

## 2016-11-18 DIAGNOSIS — C4491 Basal cell carcinoma of skin, unspecified: Secondary | ICD-10-CM

## 2016-11-18 HISTORY — DX: Basal cell carcinoma of skin, unspecified: C44.91

## 2016-11-18 HISTORY — DX: Squamous cell carcinoma of skin, unspecified: C44.92

## 2016-12-17 ENCOUNTER — Other Ambulatory Visit: Payer: Self-pay | Admitting: Dermatology

## 2016-12-17 DIAGNOSIS — C4491 Basal cell carcinoma of skin, unspecified: Secondary | ICD-10-CM

## 2016-12-17 HISTORY — DX: Basal cell carcinoma of skin, unspecified: C44.91

## 2017-04-25 ENCOUNTER — Emergency Department (HOSPITAL_COMMUNITY)
Admission: EM | Admit: 2017-04-25 | Discharge: 2017-04-25 | Disposition: A | Discharge status: Home / Self Care | Payer: Medicare Other | Attending: Emergency Medicine | Admitting: Emergency Medicine

## 2017-04-25 ENCOUNTER — Emergency Department (HOSPITAL_COMMUNITY): Payer: Medicare Other

## 2017-04-25 ENCOUNTER — Encounter (HOSPITAL_COMMUNITY): Payer: Self-pay | Admitting: Emergency Medicine

## 2017-04-25 ENCOUNTER — Other Ambulatory Visit: Payer: Self-pay

## 2017-04-25 DIAGNOSIS — I10 Essential (primary) hypertension: Secondary | ICD-10-CM | POA: Diagnosis not present

## 2017-04-25 DIAGNOSIS — Z79899 Other long term (current) drug therapy: Secondary | ICD-10-CM | POA: Diagnosis not present

## 2017-04-25 DIAGNOSIS — Y939 Activity, unspecified: Secondary | ICD-10-CM | POA: Diagnosis not present

## 2017-04-25 DIAGNOSIS — S298XXA Other specified injuries of thorax, initial encounter: Secondary | ICD-10-CM | POA: Diagnosis present

## 2017-04-25 DIAGNOSIS — E039 Hypothyroidism, unspecified: Secondary | ICD-10-CM | POA: Insufficient documentation

## 2017-04-25 DIAGNOSIS — W19XXXA Unspecified fall, initial encounter: Secondary | ICD-10-CM

## 2017-04-25 DIAGNOSIS — Y999 Unspecified external cause status: Secondary | ICD-10-CM | POA: Insufficient documentation

## 2017-04-25 DIAGNOSIS — Y92009 Unspecified place in unspecified non-institutional (private) residence as the place of occurrence of the external cause: Secondary | ICD-10-CM | POA: Insufficient documentation

## 2017-04-25 DIAGNOSIS — S2232XA Fracture of one rib, left side, initial encounter for closed fracture: Secondary | ICD-10-CM | POA: Insufficient documentation

## 2017-04-25 DIAGNOSIS — W010XXA Fall on same level from slipping, tripping and stumbling without subsequent striking against object, initial encounter: Secondary | ICD-10-CM | POA: Diagnosis not present

## 2017-04-25 MED ORDER — TRAMADOL HCL 50 MG PO TABS: 50.0000 mg | ORAL_TABLET | Freq: Four times a day (QID) | ORAL | 0 refills | Status: DC | PRN

## 2017-04-25 NOTE — ED Triage Notes (Signed)
Pt reports tripped and fell last Monday and reports left rib pain ever since. Pt denies loc, being on blood thinners,or difficulty breathing. Pt reports pain is worse with movement and deep breath.nad noted. Chest expansion symmetrical.

## 2017-04-25 NOTE — Discharge Instructions (Signed)
You may use the tramadol as prescribed if needed for pain relief.  Also use the incentive spirometer as instructed.  Splint the area of pain with the pillow or your arm if you need to cough or sneeze.  Plan to see your doctor next week as you have already scheduled.  There is a nonspecific nodule in your lung based on today's CAT scan and it is recommended that you have a repeat CAT scan in 3 months make sure this is not changing over time.  Dr. Edrick Oh can schedule this for you.

## 2017-04-25 NOTE — ED Provider Notes (Signed)
Medical screening examination/treatment/procedure(s) were conducted as a shared visit with non-physician practitioner(s) and myself.  I personally evaluated the patient during the encounter.   Patient seen by me along with physician assistant.  Patient had a fall on Tuesday triage mention Monday but it was Tuesday he has had pain to his left anterior chest rib area since the fall.  Also had a little abrasion or cut to his upper lip.  No loss of consciousness.  No mental changes.  No difficulty breathing is not on blood thinners.  Does have pain in the left anterior chest area with movement and deep breath.   X-rays revealed evidence of a posterior rib fracture.  There could be some contusion to the cartilage to the anterior part of the ribs.  Recommend treatment with a mild pain medicine like tramadol and an incentive spirometer.  And precautions for returning for any signs of worse breathing or fevers.  On exam patient is alert and oriented no acute distress.  Tenderness to palpation to the chest wall on the left side.  Lungs are clear bilaterally.  Heart regular.  Abdomen nontender.   EKG Interpretation None       Results for orders placed or performed during the hospital encounter of 04/29/15  CBC  Result Value Ref Range   WBC 8.6 4.0 - 10.5 K/uL   RBC 4.38 4.22 - 5.81 MIL/uL   Hemoglobin 14.0 13.0 - 17.0 g/dL   HCT 43.0 39.0 - 52.0 %   MCV 98.2 78.0 - 100.0 fL   MCH 32.0 26.0 - 34.0 pg   MCHC 32.6 30.0 - 36.0 g/dL   RDW 13.5 11.5 - 15.5 %   Platelets 125 (L) 150 - 400 K/uL  Basic metabolic panel  Result Value Ref Range   Sodium 139 135 - 145 mmol/L   Potassium 4.1 3.5 - 5.1 mmol/L   Chloride 104 101 - 111 mmol/L   CO2 27 22 - 32 mmol/L   Glucose, Bld 122 (H) 65 - 99 mg/dL   BUN 26 (H) 6 - 20 mg/dL   Creatinine, Ser 1.45 (H) 0.61 - 1.24 mg/dL   Calcium 9.0 8.9 - 10.3 mg/dL   GFR calc non Af Amer 40 (L) >60 mL/min   GFR calc Af Amer 47 (L) >60 mL/min   Anion gap 8 5 - 15    Ct Chest Wo Contrast  Result Date: 04/25/2017 CLINICAL DATA:  Left rib pain since fall 5 days ago. EXAM: CT CHEST WITHOUT CONTRAST TECHNIQUE: Multidetector CT imaging of the chest was performed following the standard protocol without IV contrast. COMPARISON:  Chest radiograph 04/29/2015 FINDINGS: Cardiovascular: Enlarged heart. Small pericardial effusion. Mild calcific atherosclerotic disease of the coronary arteries and aorta. Mediastinum/Nodes: No enlarged mediastinal or axillary lymph nodes. Thyroid gland, trachea, and esophagus demonstrate no significant findings. Lungs/Pleura: Mild right apical scarring. Right basilar posterior 1.8 cm soft tissue nodule, in direct communication with the area of linear atelectasis versus scarring. Image 123/143, sequence 4. Upper Abdomen: No acute abnormality. Musculoskeletal: Nondisplaced fracture of the posterior left eleventh rib IMPRESSION: Nondisplaced fracture the posterior left 11 rib, which may be the source of patient's pain. 1.8 cm soft tissue nodule in the posterior aspect of the right lower lobe. Consider one of the following in 3 months for both low-risk and high-risk individuals: (a) repeat chest CT, (b) follow-up PET-CT, or (c) tissue sampling. This recommendation follows the consensus statement: Guidelines for Management of Incidental Pulmonary Nodules Detected on CT Images: From  the Fleischner Society 2017; Radiology 2017; [phone]. Enlarged heart.  Small pericardial effusion, possibly trivial. Aortic Atherosclerosis (ICD10-I70.0). Electronically Signed   By: Fidela Salisbury M.D.   On: 04/25/2017 15:24     Fredia Sorrow, MD 04/25/17 1721

## 2017-04-25 NOTE — ED Provider Notes (Signed)
[facility] EMERGENCY DEPARTMENT Provider Note   CSN: [id number] Arrival date & time: 04/25/17  1249     History   Chief Complaint Chief Complaint  Patient presents with  . Fall    HPI Hayden Davis is a 84 y.o. male with a past medical history as outlined below presenting for evaluation of a fall which happened 3 days ago.  He describes tripping over a rug in his home,  Falling forward and hitting his face and chest against the floor.  He states he had a pen and glasses in his shirt pocket which he landed directly on, causing injury. He sustained a now healing abrasion of his upper lip, denies dental or nasal pain but did hit his forehead on the floor.  He denies headache, loc at the time of the event, vision changes, neck pain and also denies focal weakness.  His chest pain is triggered by deep inspiration and ok at rest. He took a muscle relaxer of his wife's 3 days ago but with no relief of pain.  The history is provided by the patient and the spouse.    Past Medical History:  Diagnosis Date  . Gout   . Hypertension   . Hypothyroid     Patient Active Problem List   Diagnosis Date Noted  . HEMORRHOIDS 10/10/2009  . COLONIC POLYPS, ADENOMATOUS, HX OF 10/10/2009  . GOUT 10/09/2009  . HYPERTENSION 10/09/2009    Past Surgical History:  Procedure Laterality Date  . APPENDECTOMY         Home Medications    Prior to Admission medications   Medication Sig Start Date End Date Taking? Authorizing Provider  allopurinol (ZYLOPRIM) 300 MG tablet Take 300 mg by mouth daily. 06/07/14   [provider]  Dextromethorphan-Guaifenesin ([place] FAST-MAX DM MAX) 5-100 MG/5ML LIQD Take 10 mLs by mouth daily as needed (for congestion).    [provider]  guaiFENesin (ROBITUSSIN) 100 MG/5ML SOLN Take 10 mLs by mouth every 4 (four) hours as needed for cough or to loosen phlegm.    [provider]  levothyroxine (SYNTHROID, LEVOTHROID) 25 MCG tablet Take 25 mcg  by mouth daily before breakfast.     [provider]  traMADol (ULTRAM) 50 MG tablet Take 1 tablet (50 mg total) by mouth every 6 (six) hours as needed. 04/25/17   Evalee Jefferson, PA-C    Family History History reviewed. No pertinent family history.  Social History Social History   Tobacco Use  . Smoking status: Never Smoker  . Smokeless tobacco: Never Used  Substance Use Topics  . Alcohol use: Yes    Comment: occ  . Drug use: No     Allergies   Patient has no known allergies.   Review of Systems Review of Systems  Constitutional: Negative for chills and fever.  HENT: Negative for congestion and sore throat.   Eyes: Negative.  Negative for visual disturbance.  Respiratory: Negative for chest tightness, shortness of breath and wheezing.   Cardiovascular: Positive for chest pain.  Gastrointestinal: Negative for abdominal pain, nausea and vomiting.  Genitourinary: Negative.   Musculoskeletal: Negative for arthralgias, joint swelling and neck pain.  Skin: Negative.  Negative for rash and wound.  Neurological: Negative for dizziness, weakness, light-headedness, numbness and headaches.  Psychiatric/Behavioral: Negative.      Physical Exam Updated Vital Signs BP (!) 152/70   Pulse 69   Temp 97.7 F (36.5 C) (Oral)   Resp 16   Ht 5\' 11"  (1.803  m)   Wt 73.5 kg (162 lb)   SpO2 100%   BMI 22.59 kg/m   Physical Exam  Constitutional: He appears well-developed and well-nourished.  HENT:  Head: Normocephalic and atraumatic.  Right Ear: Tympanic membrane normal. No hemotympanum.  Left Ear: Tympanic membrane normal. No hemotympanum.  Eyes: Conjunctivae and EOM are normal. Pupils are equal, round, and reactive to light.  Neck: Normal range of motion.  Cardiovascular: Normal rate, regular rhythm, normal heart sounds and intact distal pulses.  Pulmonary/Chest: Effort normal and breath sounds normal. No respiratory distress. He has no decreased breath sounds. He has no  wheezes. He has no rhonchi. He has no rales. He exhibits tenderness. He exhibits no crepitus, no edema and no swelling.  No pain to palpation at the left posterior back.  No crepitus, no deformity at the site of injury.    Abdominal: Soft. Bowel sounds are normal. There is no tenderness.  Musculoskeletal: Normal range of motion.       Cervical back: He exhibits no bony tenderness.  Neurological: He is alert.  Skin: Skin is warm and dry.  Psychiatric: He has a normal mood and affect.  Nursing note and vitals reviewed.    ED Treatments / Results  Labs (all labs ordered are listed, but only abnormal results are displayed) Labs Reviewed - No data to display  EKG  EKG Interpretation None       Radiology Ct Chest Wo Contrast  Result Date: 04/25/2017 CLINICAL DATA:  Left rib pain since fall 5 days ago. EXAM: CT CHEST WITHOUT CONTRAST TECHNIQUE: Multidetector CT imaging of the chest was performed following the standard protocol without IV contrast. COMPARISON:  Chest radiograph 04/29/2015 FINDINGS: Cardiovascular: Enlarged heart. Small pericardial effusion. Mild calcific atherosclerotic disease of the coronary arteries and aorta. Mediastinum/Nodes: No enlarged mediastinal or axillary lymph nodes. Thyroid gland, trachea, and esophagus demonstrate no significant findings. Lungs/Pleura: Mild right apical scarring. Right basilar posterior 1.8 cm soft tissue nodule, in direct communication with the area of linear atelectasis versus scarring. Image 123/143, sequence 4. Upper Abdomen: No acute abnormality. Musculoskeletal: Nondisplaced fracture of the posterior left eleventh rib IMPRESSION: Nondisplaced fracture the posterior left 11 rib, which may be the source of patient's pain. 1.8 cm soft tissue nodule in the posterior aspect of the right lower lobe. Consider one of the following in 3 months for both low-risk and high-risk individuals: (a) repeat chest CT, (b) follow-up PET-CT, or (c) tissue  sampling. This recommendation follows the consensus statement: Guidelines for Management of Incidental Pulmonary Nodules Detected on CT Images: From the Fleischner Society 2017; Radiology 2017; 284:228-243. Enlarged heart.  Small pericardial effusion, possibly trivial. Aortic Atherosclerosis (ICD10-I70.0). Electronically Signed   By: Fidela Salisbury M.D.   On: 04/25/2017 15:24    Procedures Procedures (including critical care time)  Medications Ordered in ED Medications - No data to display   Initial Impression / Assessment and Plan / ED Course  I have reviewed the triage vital signs and the nursing notes.  Pertinent labs & imaging results that were available during my care of the patient were reviewed by me and considered in my medical decision making (see chart for details).     CT imaging reviewed and discussed with patient.  Patient was also seen by Dr. Rogene Houston during this visit.  We will prescribe tramadol for pain, also discussed heat therapy.  Plan follow-up with his PCP as needed.  He was advised of the lung nodule with recommended repeat CT imaging  in 3 months.  He has an appointment with his PCP next week and he will discuss with him at that visit.  Final Clinical Impressions(s) / ED Diagnoses   Final diagnoses:  Fall, initial encounter  Closed fracture of one rib of left side, initial encounter    ED Discharge Orders        Ordered    traMADol (ULTRAM) 50 MG tablet  Every 6 hours PRN     04/25/17 1717       Evalee Jefferson, PA-C 04/25/17 1718    Fredia Sorrow, MD 04/26/17 1513

## 2017-06-19 DIAGNOSIS — E7849 Other hyperlipidemia: Secondary | ICD-10-CM | POA: Insufficient documentation

## 2017-06-19 DIAGNOSIS — I129 Hypertensive chronic kidney disease with stage 1 through stage 4 chronic kidney disease, or unspecified chronic kidney disease: Secondary | ICD-10-CM | POA: Insufficient documentation

## 2018-07-22 IMAGING — CT CT CHEST W/O CM
2 of 4 series · 15 of 36 positions shown, 18 images · non-contrast
Comparison: Chest radiograph 04/29/2015

CLINICAL DATA: Left rib pain since fall 5 days ago.

EXAM:
CT CHEST WITHOUT CONTRAST
TECHNIQUE: Multidetector CT imaging of the chest was performed following the
standard protocol without IV contrast.

[Series 2: thorax · axial · 0.68mm/px · z∈[+1330,+1610]mm · 12 of 164 slices shown, 15 images]
[im 12/164  mediastinal]
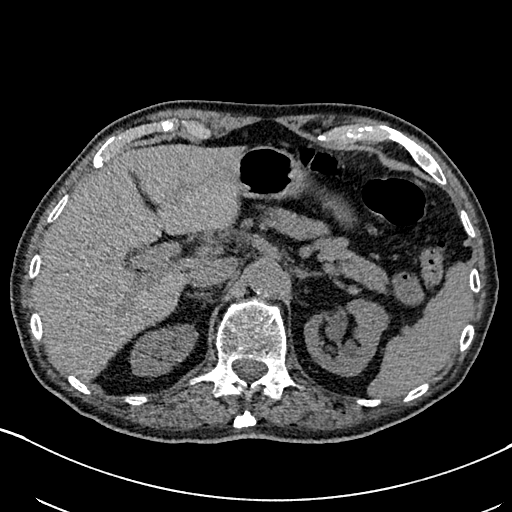
[im 12/164  lung]
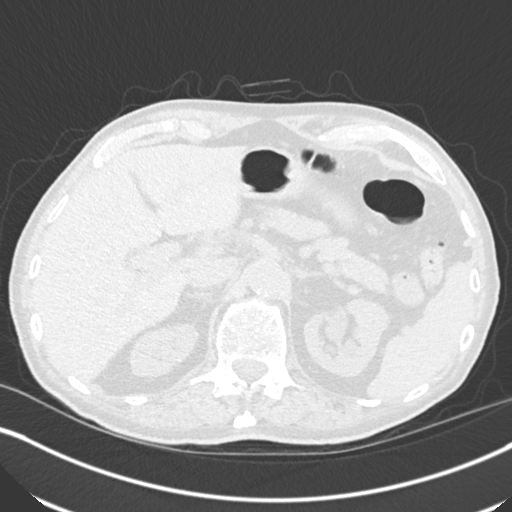
[im 24/164  lung]
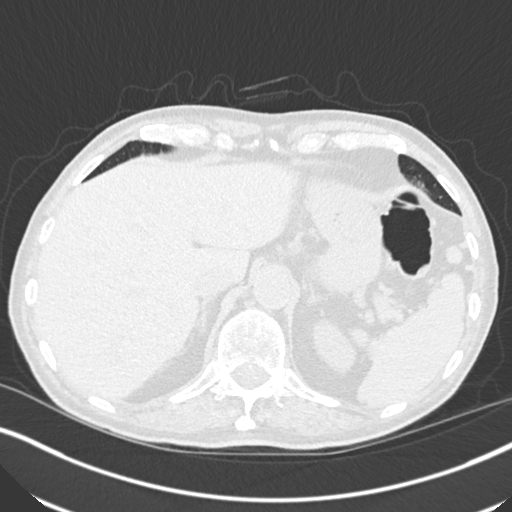
[im 35/164  lung]
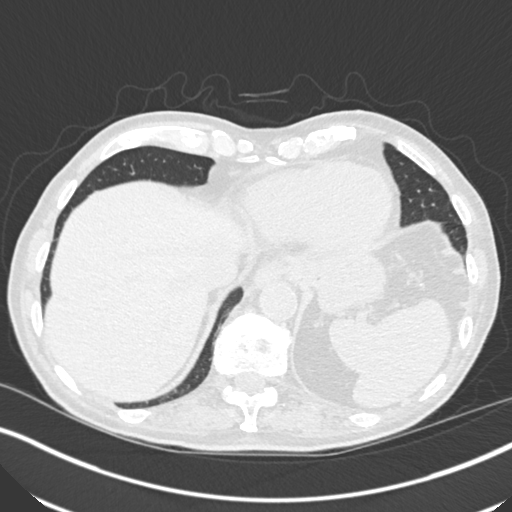
[im 47/164  lung]
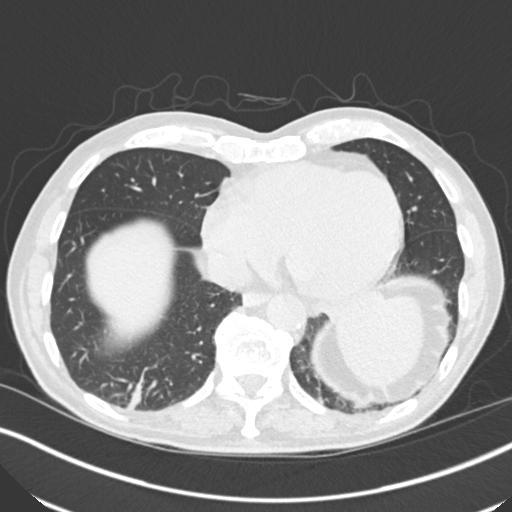
[im 59/164  mediastinal]
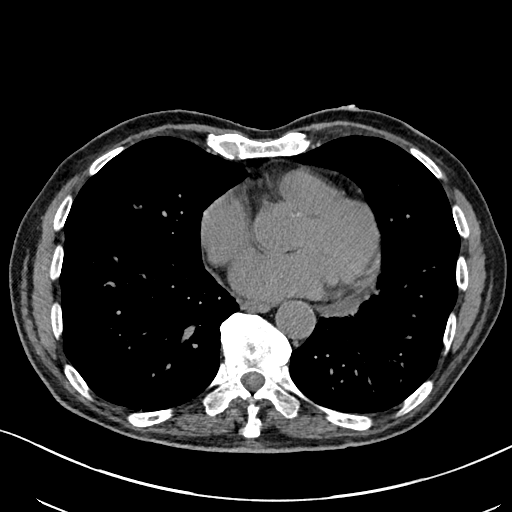
[im 59/164  lung]
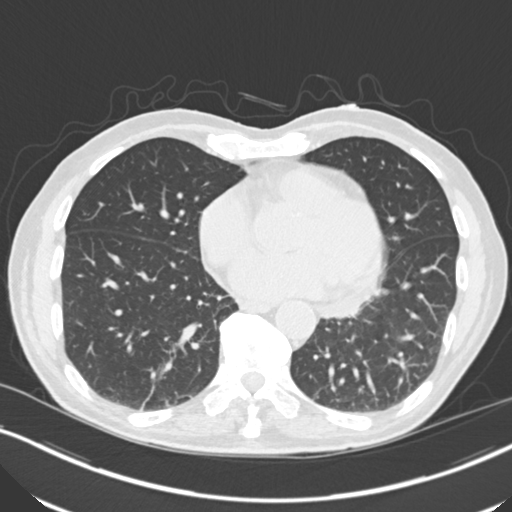
[im 70/164  lung]
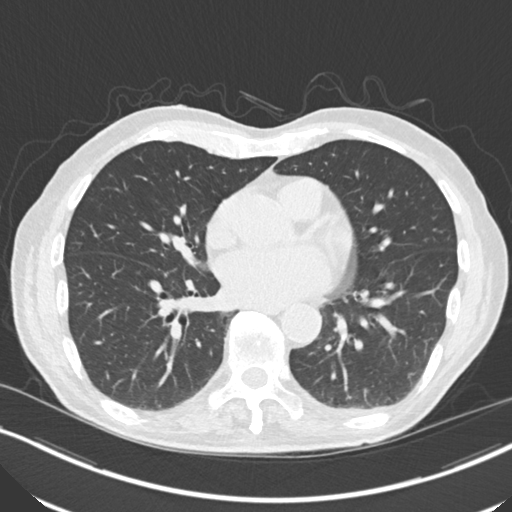
[im 94/164  lung]
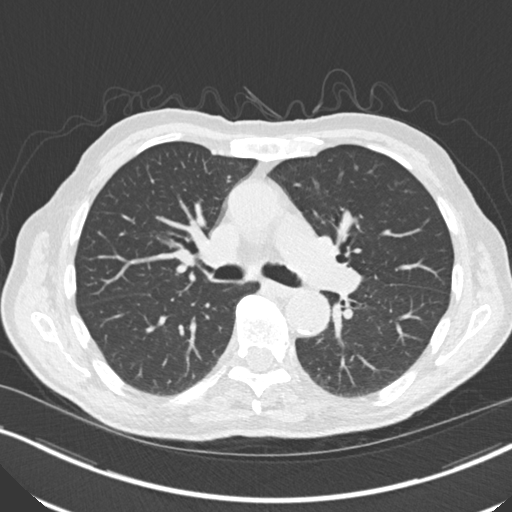
[im 105/164  lung]
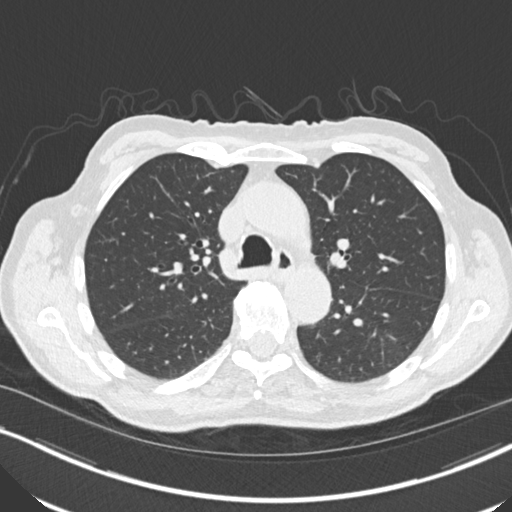
[im 117/164  mediastinal]
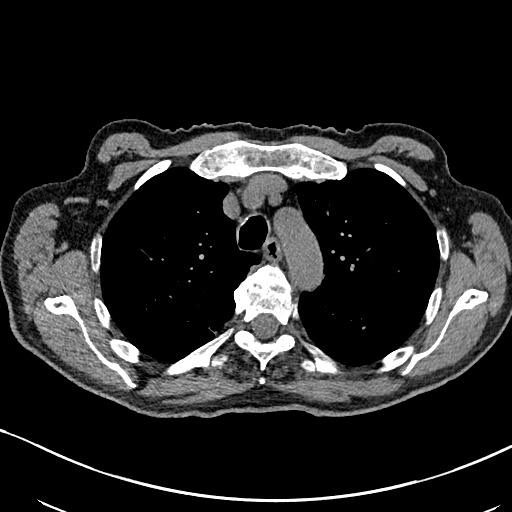
[im 117/164  lung]
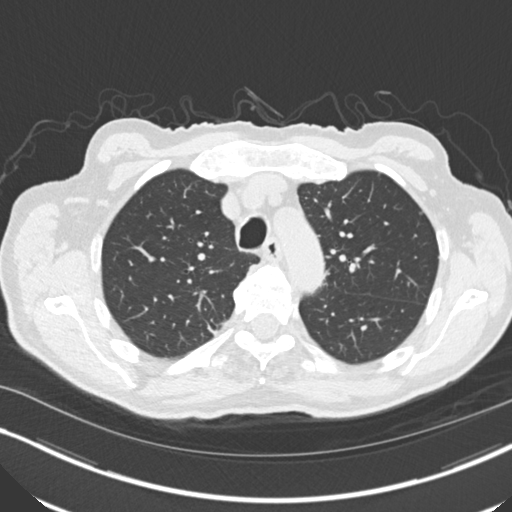
[im 129/164  lung]
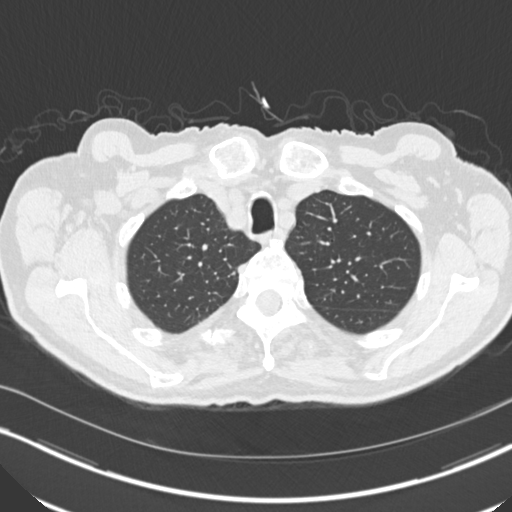
[im 140/164  lung]
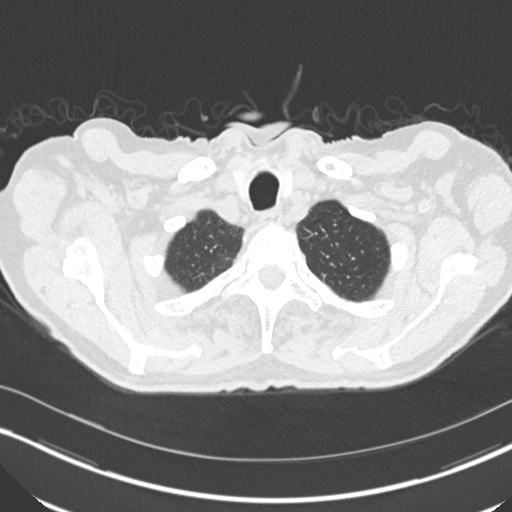
[im 152/164  lung]
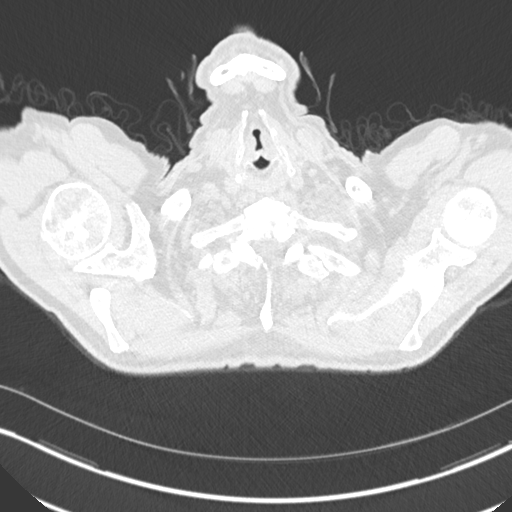

[Series 5: coronal · coronal · 0.66mm/px · 3 of 127 slices shown]
[im 26/127  lung]
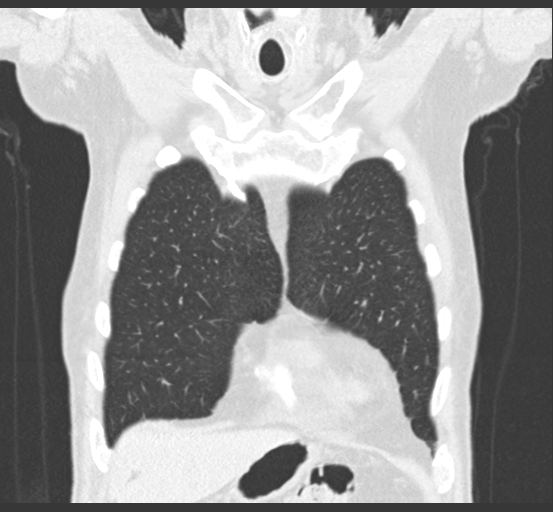
[im 51/127  lung]
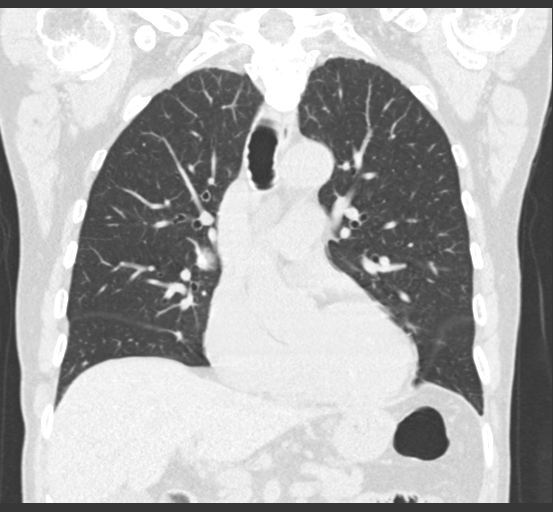
[im 76/127  lung]
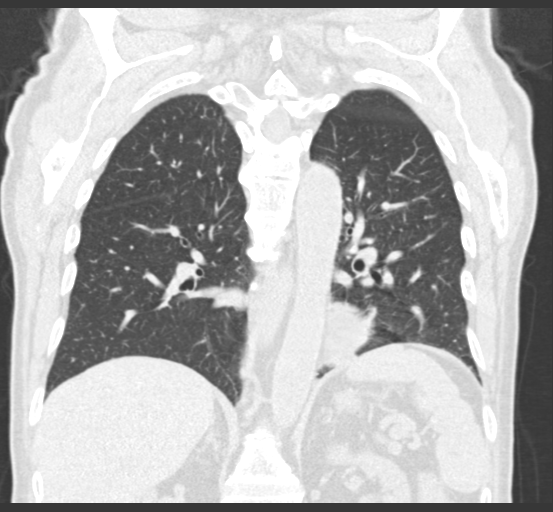

[15 of 36 positions shown; findings below may reference images not displayed]

FINDINGS: Cardiovascular: Enlarged heart. Small pericardial effusion. Mild
calcific atherosclerotic disease of the coronary arteries and aorta.

Mediastinum/Nodes: No enlarged mediastinal or axillary lymph nodes.
Thyroid gland, trachea, and esophagus demonstrate no significant
findings.

Lungs/Pleura: Mild right apical scarring. Right basilar posterior
1.8 cm soft tissue nodule, in direct communication with the area of
linear atelectasis versus scarring. Image 123/143, sequence 4.

Upper Abdomen: No acute abnormality.

Musculoskeletal: Nondisplaced fracture of the posterior left
eleventh rib
IMPRESSION: Nondisplaced fracture the posterior left 11 rib, which may be the
source of patient's pain.

1.8 cm soft tissue nodule in the posterior aspect of the right lower
lobe. Consider one of the following in 3 months for both low-risk
and high-risk individuals: (a) repeat chest CT, (b) follow-up
PET-CT, or (c) tissue sampling. This recommendation follows the
consensus statement: Guidelines for Management of Incidental
Pulmonary Nodules Detected on CT Images: From the [HOSPITAL]

Enlarged heart.  Small pericardial effusion, possibly trivial.

Aortic Atherosclerosis ([id number]).

## 2018-11-23 DIAGNOSIS — N183 Chronic kidney disease, stage 3 unspecified: Secondary | ICD-10-CM | POA: Insufficient documentation

## 2018-11-23 DIAGNOSIS — K219 Gastro-esophageal reflux disease without esophagitis: Secondary | ICD-10-CM | POA: Insufficient documentation

## 2018-12-04 ENCOUNTER — Other Ambulatory Visit: Payer: Self-pay

## 2018-12-04 ENCOUNTER — Ambulatory Visit (INDEPENDENT_AMBULATORY_CARE_PROVIDER_SITE_OTHER): Payer: Commercial Managed Care - HMO | Admitting: Urology

## 2018-12-04 DIAGNOSIS — N401 Enlarged prostate with lower urinary tract symptoms: Secondary | ICD-10-CM | POA: Diagnosis not present

## 2018-12-04 DIAGNOSIS — R3912 Poor urinary stream: Secondary | ICD-10-CM | POA: Diagnosis not present

## 2019-03-05 ENCOUNTER — Other Ambulatory Visit: Payer: Self-pay

## 2019-03-05 ENCOUNTER — Ambulatory Visit (INDEPENDENT_AMBULATORY_CARE_PROVIDER_SITE_OTHER): Payer: Commercial Managed Care - HMO | Admitting: Urology

## 2019-03-05 DIAGNOSIS — R310 Gross hematuria: Secondary | ICD-10-CM

## 2019-03-05 DIAGNOSIS — N401 Enlarged prostate with lower urinary tract symptoms: Secondary | ICD-10-CM

## 2019-03-05 DIAGNOSIS — R3912 Poor urinary stream: Secondary | ICD-10-CM | POA: Diagnosis not present

## 2019-03-08 ENCOUNTER — Other Ambulatory Visit: Payer: Self-pay | Admitting: Urology

## 2019-03-08 DIAGNOSIS — R310 Gross hematuria: Secondary | ICD-10-CM

## 2019-03-16 ENCOUNTER — Ambulatory Visit (HOSPITAL_COMMUNITY)
Admission: RE | Admit: 2019-03-16 | Discharge: 2019-03-16 | Disposition: A | Discharge status: Home / Self Care | Payer: Medicare HMO | Source: Ambulatory Visit | Attending: Urology | Admitting: Urology

## 2019-03-16 ENCOUNTER — Other Ambulatory Visit: Payer: Self-pay

## 2019-03-16 DIAGNOSIS — R310 Gross hematuria: Secondary | ICD-10-CM | POA: Insufficient documentation

## 2019-05-07 ENCOUNTER — Encounter: Payer: Self-pay | Admitting: Urology

## 2019-05-07 ENCOUNTER — Other Ambulatory Visit: Payer: Self-pay

## 2019-05-07 ENCOUNTER — Ambulatory Visit (INDEPENDENT_AMBULATORY_CARE_PROVIDER_SITE_OTHER): Payer: Commercial Managed Care - HMO | Admitting: Urology

## 2019-05-07 VITALS — BP 108/60 | HR 71 | Temp 96.3°F | Ht 71.0 in | Wt 160.0 lb

## 2019-05-07 DIAGNOSIS — N138 Other obstructive and reflux uropathy: Secondary | ICD-10-CM

## 2019-05-07 DIAGNOSIS — R310 Gross hematuria: Secondary | ICD-10-CM | POA: Diagnosis not present

## 2019-05-07 DIAGNOSIS — R39198 Other difficulties with micturition: Secondary | ICD-10-CM | POA: Insufficient documentation

## 2019-05-07 DIAGNOSIS — N401 Enlarged prostate with lower urinary tract symptoms: Secondary | ICD-10-CM | POA: Diagnosis not present

## 2019-05-07 LAB — POCT URINALYSIS DIPSTICK
Bilirubin, UA: NEGATIVE
Glucose, UA: NEGATIVE
Ketones, UA: NEGATIVE
Leukocytes, UA: NEGATIVE
Nitrite, UA: NEGATIVE
Protein, UA: POSITIVE — AB
Spec Grav, UA: 1.025 (ref 1.010–1.025)
Urobilinogen, UA: NEGATIVE E.U./dL — AB
pH, UA: 5 (ref 5.0–8.0)

## 2019-05-07 MED ORDER — CIPROFLOXACIN HCL 500 MG PO TABS
500.0000 mg | ORAL_TABLET | Freq: Once | ORAL | Status: AC
  Administered 2019-05-07: 11:00:00 500 mg via ORAL

## 2019-05-07 NOTE — Progress Notes (Signed)
Subjective:  1. BPH with urinary obstruction   2. Decreased urine stream   3. Gross hematuria     Hayden Davis returns today in f/u.  He has a history of BPH with BOO and hematuria.  He is back on finasteride and has had no further bleeding.  He continues to have some LUTS with some hesitancy in the mornings.  His IPSS is 19.  He has a reduced stream and nocturia x 2.  He had a renal US that shows some renal cortical atrophy and a 176ml prostate with intravesical extension.      ROS:  Review of Systems  All other systems reviewed and are negative.   No Known Allergies  Outpatient Encounter Medications as of 05/07/2019  Medication Sig Note  . Dextromethorphan-Guaifenesin (MUCINEX FAST-MAX DM MAX) 5-100 MG/5ML LIQD Take 10 mLs by mouth daily as needed (for congestion).   Marland Kitchen guaiFENesin (ROBITUSSIN) 100 MG/5ML SOLN Take 10 mLs by mouth every 4 (four) hours as needed for cough or to loosen phlegm.   Marland Kitchen levothyroxine (SYNTHROID, LEVOTHROID) 25 MCG tablet Take 25 mcg by mouth daily before breakfast.  07/22/2014: Received from: [place]  . pantoprazole (PROTONIX) 40 MG tablet Take by mouth.   . traMADol (ULTRAM) 50 MG tablet Take 1 tablet (50 mg total) by mouth every 6 (six) hours as needed.   Marland Kitchen allopurinol (ZYLOPRIM) 300 MG tablet Take 300 mg by mouth daily. 07/22/2014: Received from: [place]: TAKE ONE TABLET BY MOUTH ONCE DAILY  . finasteride (PROSCAR) 5 MG tablet    . [EXPIRED] ciprofloxacin (CIPRO) tablet 500 mg     No facility-administered encounter medications on file as of 05/07/2019.     Past Medical History:  Diagnosis Date  . Gout   . Hypertension   . Hypothyroid     Past Surgical History:  Procedure Laterality Date  . APPENDECTOMY      Social History   Socioeconomic History  . Marital status: Married    Spouse name: Not on file  . Number of children: Not on file  . Years of education: Not on file  . Highest education level: Not on file   Occupational History  . Not on file  Tobacco Use  . Smoking status: Never Smoker  . Smokeless tobacco: Never Used  Substance and Sexual Activity  . Alcohol use: Yes    Comment: occ  . Drug use: No  . Sexual activity: Not on file  Other Topics Concern  . Not on file  Social History Narrative  . Not on file   Social Determinants of Health   Financial Resource Strain:   . Difficulty of Paying Living Expenses: Not on file  Food Insecurity:   . Worried About Charity fundraiser in the Last Year: Not on file  . Ran Out of Food in the Last Year: Not on file  Transportation Needs:   . Lack of Transportation (Medical): Not on file  . Lack of Transportation (Non-Medical): Not on file  Physical Activity:   . Days of Exercise per Week: Not on file  . Minutes of Exercise per Session: Not on file  Stress:   . Feeling of Stress : Not on file  Social Connections:   . Frequency of Communication with Friends and Family: Not on file  . Frequency of Social Gatherings with Friends and Family: Not on file  . Attends Religious Services: Not on file  . Active Member of Clubs or Organizations: Not  on file  . Attends Archivist Meetings: Not on file  . Marital Status: Not on file  Intimate Partner Violence:   . Fear of Current or Ex-Partner: Not on file  . Emotionally Abused: Not on file  . Physically Abused: Not on file  . Sexually Abused: Not on file    History reviewed. No pertinent family history.     Objective: Vitals:   05/07/19 0945  BP: 108/60  Pulse: 71  Temp: (!) 96.3 F (35.7 C)     Physical Exam  Lab Results:  Results for orders placed or performed in visit on 05/07/19 (from the past 24 hour(s))  POCT urinalysis dipstick     Status: Abnormal   Collection Time: 05/07/19 11:05 AM  Result Value Ref Range   Color, UA red    Clarity, UA cloudy    Glucose, UA Negative Negative   Bilirubin, UA neg    Ketones, UA neg    Spec Grav, UA 1.025 1.010 - 1.025    Blood, UA large    pH, UA 5.0 5.0 - 8.0   Protein, UA Positive (A) Negative   Urobilinogen, UA negative (A) 0.2 or 1.0 E.U./dL   Nitrite, UA neg    Leukocytes, UA Negative Negative   Appearance cloudy    Odor      BMET No results for input(s): NA, K, CL, CO2, GLUCOSE, BUN, CREATININE, CALCIUM in the last 72 hours. PSA      Studies/Results: Renal US results reviewed.   Cystoscopy:   He was prepped with betadine and the urethra was instilled with lidocaine jelly.  He was given Cipro 500mg  po for prophylaxis.  Urethra: normal with the exception of a healed posterior bulbar false passage. Prostate: 7-8cm with trilobar hyperplasia and obstruction with inflammatory polyps that bled on scope passage. UO's: normal. Bladder: Mild trabeculation. No stones or mucosal lesions.     Assessment & Plan: BPH with BOO with hematuria:   The hematuria has resolved on finasteride.    He is content with his voiding symptoms at this time.     Meds ordered this encounter  Medications  . ciprofloxacin (CIPRO) tablet 500 mg     Orders Placed This Encounter  Procedures  . POCT urinalysis dipstick      Return in about 6 weeks (around 06/18/2019).   CC: Chesley Noon, MD      [name] 05/07/2019

## 2019-05-07 NOTE — Progress Notes (Signed)
Urological Symptom Review  Patient is experiencing the following symptoms: Urinary retention   Review of Systems  Gastrointestinal (upper)  : Negative for upper GI symptoms  Gastrointestinal (lower) : Negative for lower GI symptoms  Constitutional : Urinary retention  Skin: Negative for skin symptoms  Eyes: Negative for eye symptoms  Ear/Nose/Throat : Negative for Ear/Nose/Throat symptoms  Hematologic/Lymphatic: Negative for Hematologic/Lymphatic symptoms  Cardiovascular : Negative for cardiovascular symptoms  Respiratory : Negative for respiratory symptoms  Endocrine: Negative for endocrine symptoms  Musculoskeletal: Negative for musculoskeletal symptoms  Neurological: Negative for neurological symptoms  Psychologic: Negative for psychiatric symptoms

## 2019-05-14 ENCOUNTER — Ambulatory Visit: Payer: Commercial Managed Care - HMO | Attending: Internal Medicine

## 2019-05-14 DIAGNOSIS — Z23 Encounter for immunization: Secondary | ICD-10-CM

## 2019-06-04 ENCOUNTER — Ambulatory Visit: Payer: Medicare HMO | Attending: Internal Medicine

## 2019-06-04 DIAGNOSIS — Z23 Encounter for immunization: Secondary | ICD-10-CM | POA: Insufficient documentation

## 2019-06-04 NOTE — Progress Notes (Signed)
   Covid-19 Vaccination Clinic  Name:  Hayden Davis    MRN: [id number] DOB: 08-28-1920  06/04/2019  Mr. Pollino was observed post Covid-19 immunization for 15 minutes without incidence. He was provided with Vaccine Information Sheet and instruction to access the V-Safe system.   Mr. Drust was instructed to call 911 with any severe reactions post vaccine: Marland Kitchen Difficulty breathing  . Swelling of your face and throat  . A fast heartbeat  . A bad rash all over your body  . Dizziness and weakness    Immunizations Administered    Name Date Dose VIS Date Route   Pfizer COVID-19 Vaccine 06/04/2019  9:39 AM 0.3 mL 04/02/2019 Intramuscular   Manufacturer: [place]   Lot: [id number]   [place]: [id number]

## 2019-06-30 ENCOUNTER — Other Ambulatory Visit: Payer: Self-pay | Admitting: Dermatology

## 2019-07-05 ENCOUNTER — Telehealth: Payer: Self-pay | Admitting: *Deleted

## 2019-07-05 NOTE — Telephone Encounter (Signed)
Phone call attempted, unable to leave message-Patient needs pathology results and 30 minute surgery appointment

## 2019-07-06 NOTE — Telephone Encounter (Signed)
Phone call to patient to give him is Pathology results, and schedule patient appointment with Dr. Denna Haggard on April 8th at 10:30am.

## 2019-07-28 ENCOUNTER — Encounter: Payer: Self-pay | Admitting: Dermatology

## 2019-07-28 ENCOUNTER — Ambulatory Visit: Payer: Medicare HMO | Admitting: Dermatology

## 2019-07-28 ENCOUNTER — Other Ambulatory Visit: Payer: Self-pay

## 2019-07-28 DIAGNOSIS — C4441 Basal cell carcinoma of skin of scalp and neck: Secondary | ICD-10-CM

## 2019-07-28 DIAGNOSIS — D485 Neoplasm of uncertain behavior of skin: Secondary | ICD-10-CM | POA: Diagnosis not present

## 2019-07-28 DIAGNOSIS — D492 Neoplasm of unspecified behavior of bone, soft tissue, and skin: Secondary | ICD-10-CM

## 2019-07-28 DIAGNOSIS — C4492 Squamous cell carcinoma of skin, unspecified: Secondary | ICD-10-CM

## 2019-07-28 HISTORY — DX: Squamous cell carcinoma of skin, unspecified: C44.92

## 2019-07-29 ENCOUNTER — Encounter: Payer: Medicare HMO | Admitting: Dermatology

## 2019-07-29 ENCOUNTER — Encounter: Payer: Self-pay | Admitting: Dermatology

## 2019-07-29 NOTE — Progress Notes (Addendum)
   Follow-Up Visit   Subjective  Hayden Davis is a 86 y.o. male who presents for the following: Procedure (Patient here today for BCC x 2 Left Front Scalp and left Side Nose).  Biopsy proven skin cancers + new lesion left side nose Location:  Duration: months Quality: nose larger, sore Associated Signs/Symptoms: Modifying Factors:  Severity:  Timing: Context: for treatment  The following portions of the chart were reviewed this encounter and updated as appropriate: Tobacco  Allergies  Meds  Problems  Med Hx  Surg Hx  Fam Hx      Objective  Well appearing patient in no apparent distress; mood and affect are within normal limits.  Head, neck scalp examined.   Assessment & Plan  Basal cell carcinoma (BCC) of scalp Mid Parietal Scalp  Destruction of lesion Complexity: extensive   Destruction method comment:  Curettage plus innoculation with parenteral fluorouracil Informed consent: discussed and consent obtained   Timeout:  patient name, date of birth, surgical site, and procedure verified Procedure prep:  Patient was prepped and draped in usual sterile fashion Prep type:  Isopropyl alcohol Anesthesia: the lesion was anesthetized in a standard fashion   Anesthetic:  1% lidocaine w/ epinephrine 1-100,000 buffered w/ 8.4% NaHCO3 Lesion length (cm):  1.4 Lesion width (cm):  1.4 Margin per side (cm):  0 Final wound size (cm):  1.4 Hemostasis achieved with:  pressure, aluminum chloride and ferric subsulfate Outcome: patient tolerated procedure well with no complications   Post-procedure details: sterile dressing applied and wound care instructions given   Dressing type: bandage and petrolatum   Additional details:  Curettage showed focal depth, ED, recuret, base inoculated with parenter 5FU.  Neoplasm of skin Left Superior nasal sidewall  Skin / nail biopsy Type of biopsy: tangential   Informed consent: discussed and consent obtained   Anesthesia: the lesion was  anesthetized in a standard fashion   Anesthetic:  1% lidocaine w/ epinephrine 1-100,000 local infiltration Instrument used: flexible razor blade   Hemostasis achieved with: ferric subsulfate   Outcome: patient tolerated procedure well   Post-procedure details: sterile dressing applied and wound care instructions given   Dressing type: petrolatum   Additional details:  Patient identified lesion of concern.  Lesion identified by physician.  Specimen 1 - Surgical pathology Differential Diagnosis: r/o bcc scc Check Margins: No

## 2019-08-02 ENCOUNTER — Telehealth: Payer: Self-pay | Admitting: Dermatology

## 2019-08-02 ENCOUNTER — Telehealth: Payer: Self-pay

## 2019-08-02 NOTE — Telephone Encounter (Signed)
Patient left a message on voice mail requesting a call back with his Pathology Report. Chart [phone]

## 2019-08-02 NOTE — Telephone Encounter (Signed)
-----   Message from Lavonna Monarch, MD sent at 07/29/2019  9:16 PM EDT ----- New bx = [facility], schedule one hour or last AM or PM surgery with Dr. Darene Lamer.

## 2019-08-02 NOTE — Telephone Encounter (Signed)
Path to patient. 1 hour with ST scheduled for April 29th @ 10am.

## 2019-08-19 ENCOUNTER — Ambulatory Visit (INDEPENDENT_AMBULATORY_CARE_PROVIDER_SITE_OTHER): Payer: Medicare HMO | Admitting: Dermatology

## 2019-08-19 ENCOUNTER — Encounter: Payer: Self-pay | Admitting: Dermatology

## 2019-08-19 ENCOUNTER — Other Ambulatory Visit: Payer: Self-pay

## 2019-08-19 DIAGNOSIS — C44321 Squamous cell carcinoma of skin of nose: Secondary | ICD-10-CM | POA: Diagnosis not present

## 2019-08-19 DIAGNOSIS — C4492 Squamous cell carcinoma of skin, unspecified: Secondary | ICD-10-CM

## 2019-08-19 NOTE — Patient Instructions (Signed)

## 2019-08-19 NOTE — Progress Notes (Signed)
   Follow-Up Visit   Subjective  Hayden Davis is a 86 y.o. male who presents for the following: Procedure (Here for treatment of atypical squamous on the left side of his nose.).  [facility] Location:  Duration:  Quality:  Associated Signs/Symptoms: Modifying Factors:  Severity:  Timing: Context:   The following portions of the chart were reviewed this encounter and updated as appropriate:     Objective  Well appearing patient in no apparent distress; mood and affect are within normal limits.  A focused examination was performed including head and neck. Relevant physical exam findings are noted in the Assessment and Plan.   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  Skin excision  Destruction of lesion  Destruction method: electrodesiccation and curettage   Destruction method comment:  Curettage plus innoculation with parenteral fluorouracil Timeout:  patient name, date of birth, surgical site, and procedure verified Procedure prep:  Patient was prepped and draped in usual sterile fashion Anesthesia: the lesion was anesthetized in a standard fashion   Anesthetic:  1% lidocaine plain local infiltration Lesion length (cm):  1.1 Lesion width (cm):  1 Margin per side (cm):  0 Final wound size (cm):  1.1 Hemostasis achieved with:  ferric subsulfate Additional details:  Curet x3, wide with only focal depty at site of inferior depressed scar, ED, recurt, base innoculated with fluorouracil. 1.1cm

## 2019-08-22 ENCOUNTER — Encounter: Payer: Self-pay | Admitting: Dermatology

## 2019-09-16 NOTE — Addendum Note (Signed)
Addended by: Ashok Cordia B on: 09/16/2019 02:26 PM   Modules accepted: Orders

## 2019-11-05 ENCOUNTER — Ambulatory Visit: Payer: Commercial Managed Care - HMO | Admitting: Urology

## 2019-11-18 NOTE — Progress Notes (Signed)
Subjective:  1. BPH with urinary obstruction   2. Nocturia   3. Microhematuria     Mr. Hayden Davis returns today in f/u.  He has a history of BPH with BOO and hematuria.  He remains on finasteride and has had no further bleeding but his UA has 3-10 RBC's today.  He continues to have some LUTS with some hesitancy in the mornings.  His IPSS is 13.  He has a reduced stream and nocturia x 1.  He had a renal US that showed renal cortical atrophy and a 186ml prostate with intravesical extension.  He has dropped 10lb in the last 18 months.    IPSS    Row Name 11/19/19 1400         International Prostate Symptom Score   How often have you had the sensation of not emptying your bladder? Less than half the time     How often have you had to urinate less than every two hours? About half the time     How often have you found you stopped and started again several times when you urinated? Less than 1 in 5 times     How often have you found it difficult to postpone urination? Less than 1 in 5 times     How often have you had a weak urinary stream? About half the time     How often have you had to strain to start urination? Less than half the time     How many times did you typically get up at night to urinate? 1 Time     Total IPSS Score 13       Quality of Life due to urinary symptoms   If you were to spend the rest of your life with your urinary condition just the way it is now how would you feel about that? Pleased             ROS:  Review of Systems  All other systems reviewed and are negative.   No Known Allergies  Outpatient Encounter Medications as of 11/19/2019  Medication Sig Note  . finasteride (PROSCAR) 5 MG tablet Take 1 tablet (5 mg total) by mouth daily.   Marland Kitchen levothyroxine (SYNTHROID) 75 MCG tablet    . pantoprazole (PROTONIX) 40 MG tablet Take by mouth.   . [DISCONTINUED] finasteride (PROSCAR) 5 MG tablet    . [DISCONTINUED] allopurinol (ZYLOPRIM) 300 MG tablet Take 300 mg by  mouth daily. 07/22/2014: Received from: [facility]: TAKE ONE TABLET BY MOUTH ONCE DAILY  . [DISCONTINUED] Dextromethorphan-Guaifenesin ([place] FAST-MAX DM MAX) 5-100 MG/5ML LIQD Take 10 mLs by mouth daily as needed (for congestion).   . [DISCONTINUED] famotidine (PEPCID) 20 MG tablet    . [DISCONTINUED] guaiFENesin (ROBITUSSIN) 100 MG/5ML SOLN Take 10 mLs by mouth every 4 (four) hours as needed for cough or to loosen phlegm.   . [DISCONTINUED] traMADol (ULTRAM) 50 MG tablet Take 1 tablet (50 mg total) by mouth every 6 (six) hours as needed.    No facility-administered encounter medications on file as of 11/19/2019.     Past Medical History:  Diagnosis Date  . BCC (basal cell carcinoma of skin) 11/18/2016   Mid Abdomen(SCC in situ) (curet and 5FU)  . BCC (basal cell carcinoma of skin) 06/30/2019   Left Side Nose  . Gout   . Hypertension   . Hypothyroid   . 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  Nodular basal cell carcinoma (BCC) 06/30/2019   Left Front Scalp (cx3 30fu cautery)  . 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 SCCA (squamous cell carcinoma) of skin 11/18/2016   Right Hand(in situ) (curet, cautery and 5FU)  . SCCA (squamous cell carcinoma) of skin 11/18/2016   Right Ear Rim(well diff) (curet, cautery and 5FU)  . Squamous cell carcinoma of skin 07/28/2019   atypical proli-left superior nasal sidewall(Cx35FU)    Past Surgical History:  Procedure Laterality Date  . APPENDECTOMY      Social History   Socioeconomic History  . Marital status: Married    Spouse name: Not on file  . Number of children: Not on file  . Years of education: Not on file  . Highest education level: Not on file  Occupational History  . Not on file  Tobacco Use  . Smoking status:  Never Smoker  . Smokeless tobacco: Never Used  Vaping Use  . Vaping Use: Never used  Substance and Sexual Activity  . Alcohol use: Yes    Comment: occ  . Drug use: No  . Sexual activity: Not on file  Other Topics Concern  . Not on file  Social History Narrative  . Not on file   Social Determinants of Health   Financial Resource Strain:   . Difficulty of Paying Living Expenses:   Food Insecurity:   . Worried About Charity fundraiser in the Last Year:   . Arboriculturist in the Last Year:   Transportation Needs:   . Film/video editor (Medical):   Marland Kitchen Lack of Transportation (Non-Medical):   Physical Activity:   . Days of Exercise per Week:   . Minutes of Exercise per Session:   Stress:   . Feeling of Stress :   Social Connections:   . Frequency of Communication with Friends and Family:   . Frequency of Social Gatherings with Friends and Family:   . Attends Religious Services:   . Active Member of Clubs or Organizations:   . Attends Archivist Meetings:   Marland Kitchen Marital Status:   Intimate Partner Violence:   . Fear of Current or Ex-Partner:   . Emotionally Abused:   Marland Kitchen Physically Abused:   . Sexually Abused:     History reviewed. No pertinent family history.     Objective: Vitals:   11/19/19 1341  BP: 123/68  Pulse: 59     Physical Exam  Lab Results:  Results for orders placed or performed in visit on 11/19/19 (from the past 24 hour(s))  Urinalysis, Routine w reflex microscopic     Status: Abnormal   Collection Time: 11/19/19  1:32 PM  Result Value Ref Range   Specific Gravity, UA 1.020 1.005 - 1.030   pH, UA 5.5 5.0 - 7.5   Color, UA Yellow Yellow   Appearance Ur Clear Clear   Leukocytes,UA Negative Negative   Protein,UA Negative Negative/Trace   Glucose, UA Negative Negative   Ketones, UA Negative Negative   RBC, UA 1+ (A) Negative   Bilirubin, UA Negative Negative   Urobilinogen, Ur 0.2 0.2 - 1.0 mg/dL   Nitrite, UA Negative Negative    Microscopic Examination See below:    Narrative   Performed at:  [facility] [address], [place], [place]  [id number] Lab Director: Dory Larsen  [place], Phone:  [phone]  Microscopic Examination     Status: Abnormal   Collection Time: 11/19/19  1:32 PM   Urine  Result Value Ref Range   WBC, UA None seen 0 - 5 /hpf   RBC 3-10 (A) 0 - 2 /hpf   Epithelial Cells (non renal) 0-10 0 - 10 /hpf   Renal Epithel, UA None seen None seen /hpf   Mucus, UA Present Not Estab.   Bacteria, UA None seen None seen/Few   Narrative   Performed at:  [facility] [address], [place], [place]  [id number] Lab Director: [place], Phone:  [phone]    BMET No results for input(s): NA, K, CL, CO2, GLUCOSE, BUN, CREATININE, CALCIUM in the last 72 hours. PSA      Studies/Results:     Assessment & Plan: BPH with BOO with gross hematuria:   The gross hematuria has resolved on finasteride but he has 3-10 RBC's.     He is content with his voiding symptoms at this time.  He has ncoturia and a reduced stream with frequency.    Meds ordered this encounter  Medications  . finasteride (PROSCAR) 5 MG tablet    Sig: Take 1 tablet (5 mg total) by mouth daily.    Dispense:  90 tablet    Refill:  3     Orders Placed This Encounter  Procedures  . Microscopic Examination  . Urinalysis, Routine w reflex microscopic      Return in about 1 year (around 11/18/2020).   CC: Chesley Noon, MD  And Dr. Jeneen Rinks [place].     [name] 11/19/2019

## 2019-11-19 ENCOUNTER — Other Ambulatory Visit: Payer: Self-pay

## 2019-11-19 ENCOUNTER — Ambulatory Visit (INDEPENDENT_AMBULATORY_CARE_PROVIDER_SITE_OTHER): Payer: Medicare HMO | Admitting: Urology

## 2019-11-19 ENCOUNTER — Encounter: Payer: Self-pay | Admitting: Urology

## 2019-11-19 VITALS — BP 123/68 | HR 59 | Wt 152.0 lb

## 2019-11-19 DIAGNOSIS — N401 Enlarged prostate with lower urinary tract symptoms: Secondary | ICD-10-CM

## 2019-11-19 DIAGNOSIS — R351 Nocturia: Secondary | ICD-10-CM | POA: Diagnosis not present

## 2019-11-19 DIAGNOSIS — R3129 Other microscopic hematuria: Secondary | ICD-10-CM

## 2019-11-19 DIAGNOSIS — N138 Other obstructive and reflux uropathy: Secondary | ICD-10-CM | POA: Diagnosis not present

## 2019-11-19 LAB — URINALYSIS, ROUTINE W REFLEX MICROSCOPIC
Bilirubin, UA: NEGATIVE
Glucose, UA: NEGATIVE
Ketones, UA: NEGATIVE
Leukocytes,UA: NEGATIVE
Nitrite, UA: NEGATIVE
Protein,UA: NEGATIVE
Specific Gravity, UA: 1.02 (ref 1.005–1.030)
Urobilinogen, Ur: 0.2 mg/dL (ref 0.2–1.0)
pH, UA: 5.5 (ref 5.0–7.5)

## 2019-11-19 LAB — MICROSCOPIC EXAMINATION
Bacteria, UA: NONE SEEN
Renal Epithel, UA: NONE SEEN /hpf
WBC, UA: NONE SEEN /hpf (ref 0–5)

## 2019-11-19 MED ORDER — FINASTERIDE 5 MG PO TABS: 5.0000 mg | ORAL_TABLET | Freq: Every day | ORAL | 3 refills | Status: DC

## 2019-11-19 NOTE — Progress Notes (Signed)
Urological Symptom Review  Patient is experiencing the following symptoms: Frequent urination Hard to postpone urination Get up at night to urinate Erection problems (male only)   Review of Systems  Gastrointestinal (upper)  : Negative for upper GI symptoms  Gastrointestinal (lower) : Negative for lower GI symptoms  Constitutional : Weight loss  Skin: Negative for skin symptoms  Eyes: Double vision  Ear/Nose/Throat : Negative for Ear/Nose/Throat symptoms  Hematologic/Lymphatic: Negative for Hematologic/Lymphatic symptoms  Cardiovascular : Negative for cardiovascular symptoms  Respiratory : Negative for respiratory symptoms  Endocrine: Negative for endocrine symptoms  Musculoskeletal: Negative for musculoskeletal symptoms  Neurological: Dizziness  Psychologic: Negative for psychiatric symptoms

## 2020-06-12 IMAGING — US US RENAL
1 series · 14 of 25 positions shown · non-contrast
Comparison: Ultrasound 11/28/2004

CLINICAL DATA: Gross hematuria

EXAM:
RENAL / URINARY TRACT ULTRASOUND COMPLETE

[Series 1: us renal · 14 of 65 slices shown]
[im 1/65]
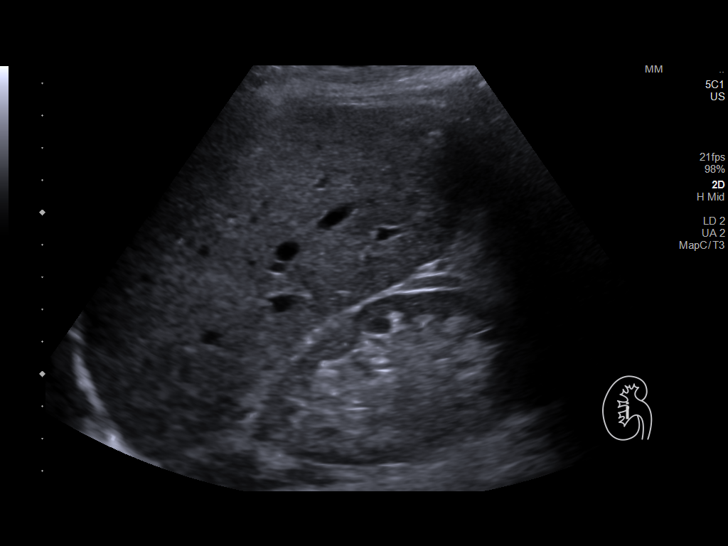
[im 6/65]
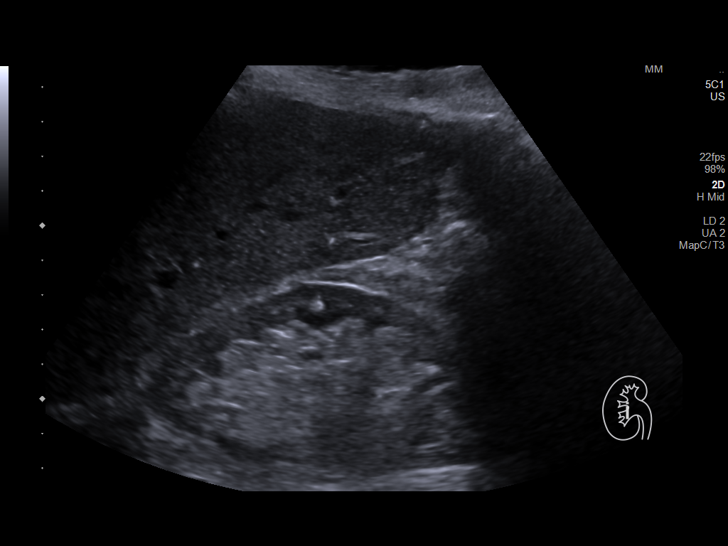
[im 11/65]
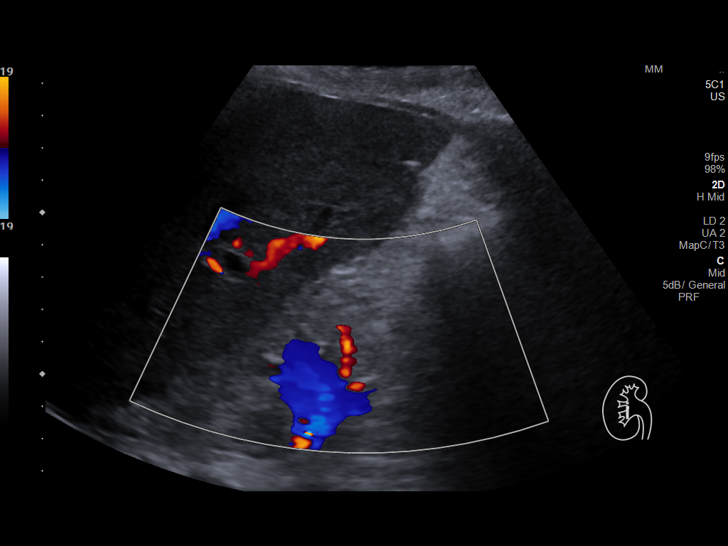
[im 17/65]
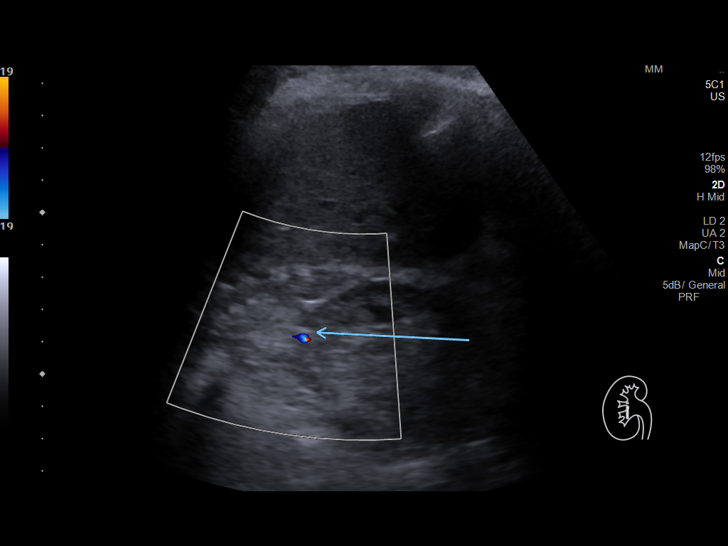
[im 22/65]
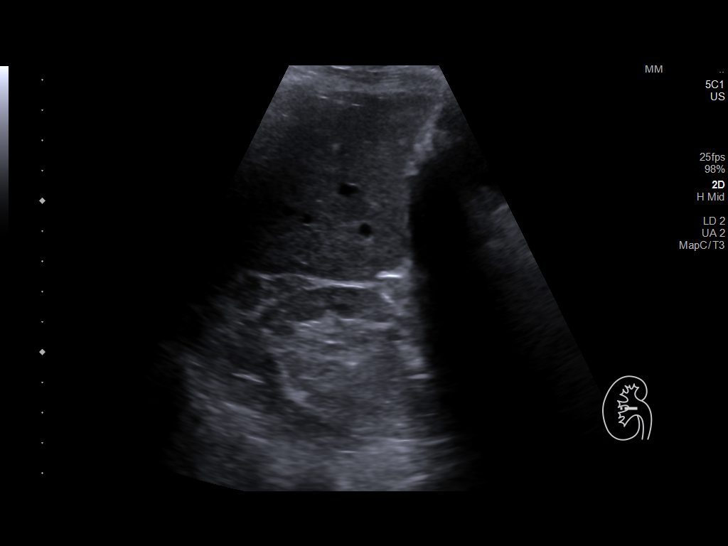
[im 25/65]
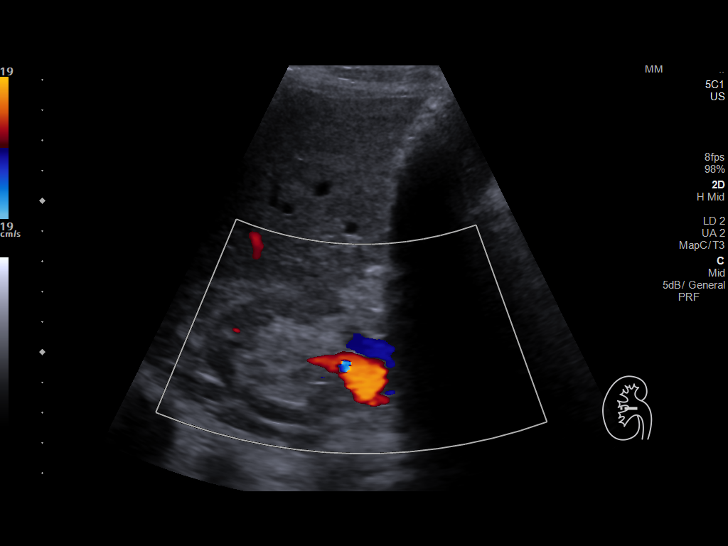
[im 30/65]
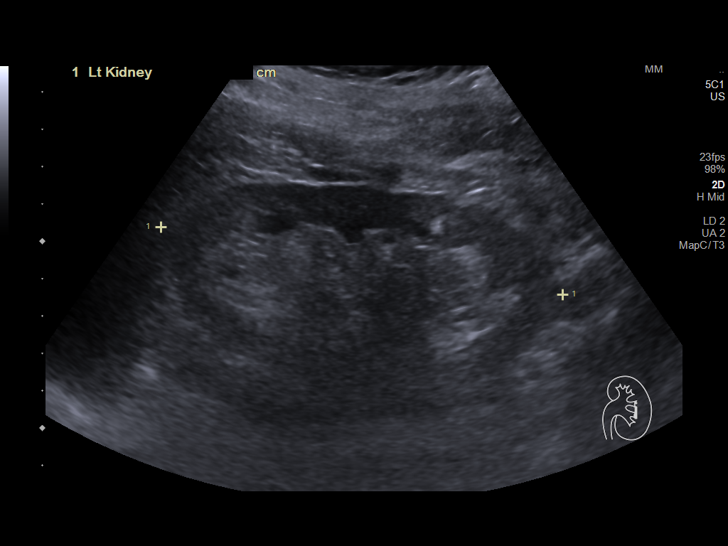
[im 35/65]
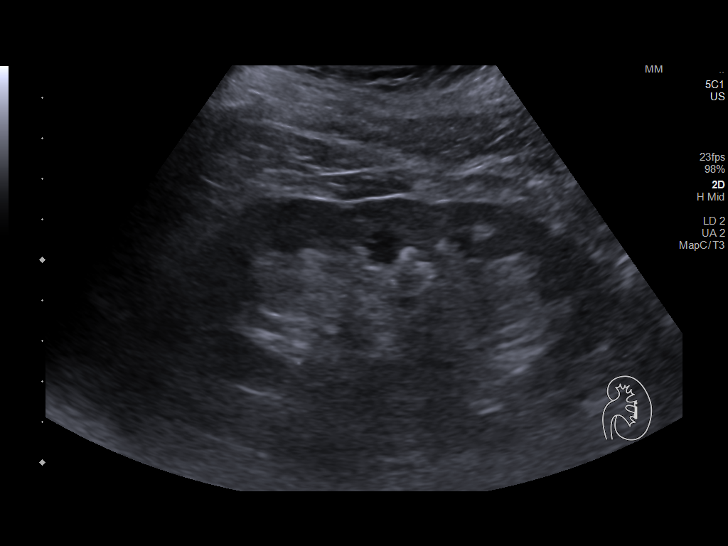
[im 41/65]
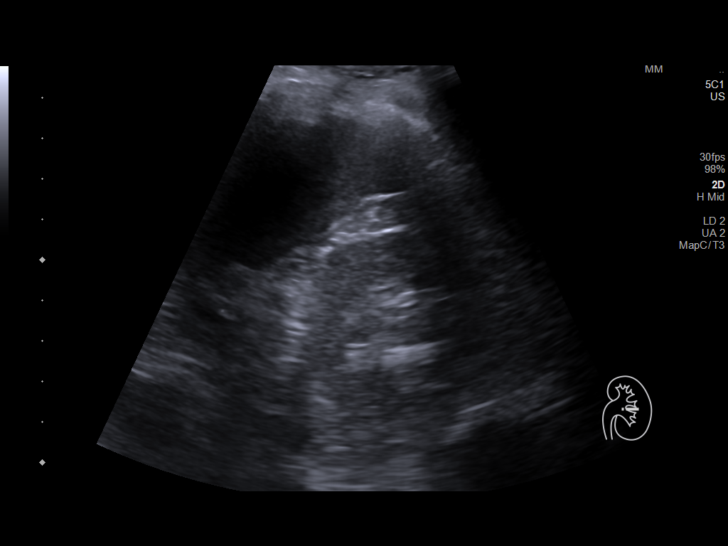
[im 43/65]
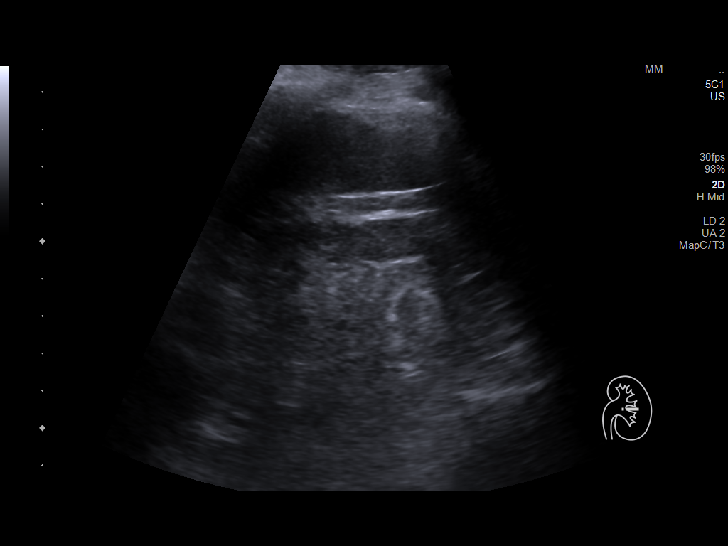
[im 49/65]
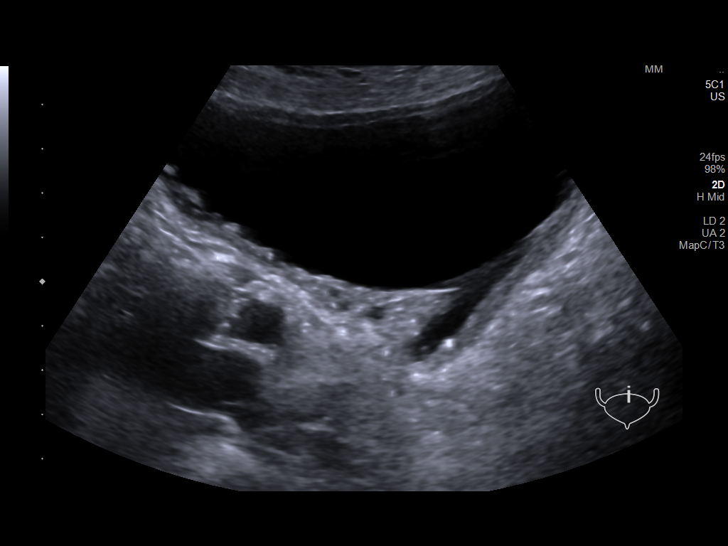
[im 54/65]
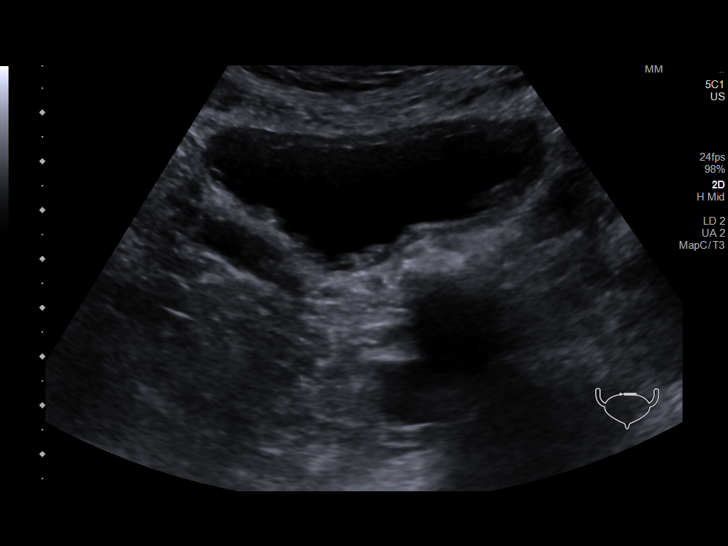
[im 59/65]
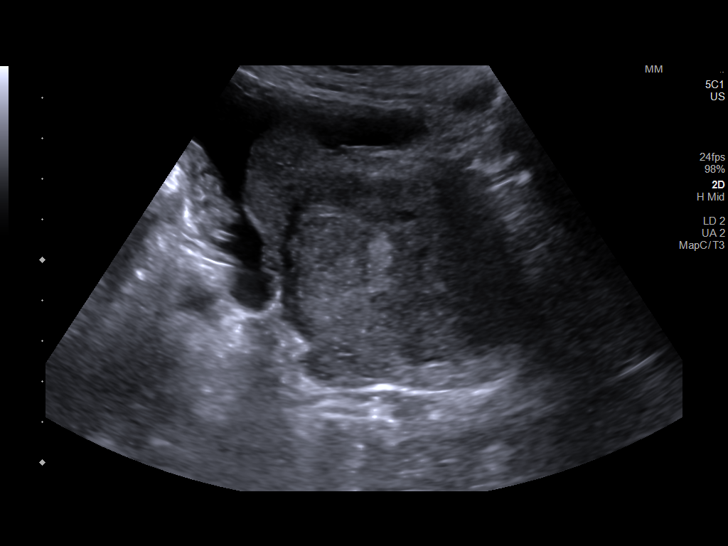
[im 65/65]
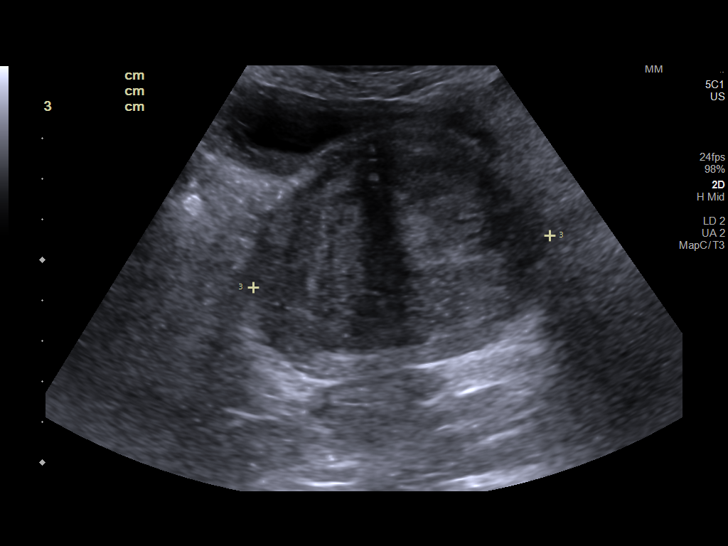

[14 of 25 positions shown; findings below may reference images not displayed]

FINDINGS: Right Kidney:

Renal measurements: 9.4 x 4.7 x 5.8 cm = volume: 135 mL. Renal
cortical thinning is present. No hydronephrosis. Possible small
stone within the upper pole measuring 4 mm.

Left Kidney:

Renal measurements: 10.9 x 4.8 x 5.5 cm = volume: 156 mL. Renal
cortical thinning is present. No hydronephrosis or mass.

Bladder:

Appears normal for degree of bladder distention.

Other:

Lobulated enlarged prostate measuring 8.2 x 6.1 x 7.4 cm with volume
of 196 mL, increased compared to prior. Lobulated projection
extending to the posterior bladder.
IMPRESSION: 1. Renal cortical thinning without evidence for kidney mass.
Possible small stone in the upper pole of the right kidney.
2. Lobulated heterogeneous enlarged prostate with masslike
projection indenting or invading the posterior bladder. This has
increased in size as compared with 4997 ultrasound.

## 2020-11-16 ENCOUNTER — Ambulatory Visit: Payer: Medicare HMO | Admitting: Urology

## 2020-11-16 ENCOUNTER — Other Ambulatory Visit: Payer: Self-pay

## 2020-11-16 ENCOUNTER — Encounter: Payer: Self-pay | Admitting: Urology

## 2020-11-16 VITALS — BP 144/67 | HR 75 | Temp 98.0°F | Wt 145.4 lb

## 2020-11-16 DIAGNOSIS — N138 Other obstructive and reflux uropathy: Secondary | ICD-10-CM | POA: Diagnosis not present

## 2020-11-16 DIAGNOSIS — R351 Nocturia: Secondary | ICD-10-CM

## 2020-11-16 DIAGNOSIS — N401 Enlarged prostate with lower urinary tract symptoms: Secondary | ICD-10-CM

## 2020-11-16 DIAGNOSIS — R3129 Other microscopic hematuria: Secondary | ICD-10-CM | POA: Diagnosis not present

## 2020-11-16 LAB — URINALYSIS, ROUTINE W REFLEX MICROSCOPIC
Bilirubin, UA: NEGATIVE
Glucose, UA: NEGATIVE
Ketones, UA: NEGATIVE
Leukocytes,UA: NEGATIVE
Nitrite, UA: NEGATIVE
Protein,UA: NEGATIVE
Specific Gravity, UA: 1.025 (ref 1.005–1.030)
Urobilinogen, Ur: 0.2 mg/dL (ref 0.2–1.0)
pH, UA: 5.5 (ref 5.0–7.5)

## 2020-11-16 LAB — MICROSCOPIC EXAMINATION
Epithelial Cells (non renal): NONE SEEN /hpf (ref 0–10)
Renal Epithel, UA: NONE SEEN /hpf
WBC, UA: NONE SEEN /hpf (ref 0–5)

## 2020-11-16 MED ORDER — FINASTERIDE 5 MG PO TABS: 5.0000 mg | ORAL_TABLET | Freq: Every day | ORAL | 3 refills | Status: DC

## 2020-11-16 NOTE — Progress Notes (Signed)
Urological Symptom Review  Patient is experiencing the following symptoms: Frequent urination Get up at night to urinate Erection problems (male only)   Review of Systems  Gastrointestinal (upper)  : Negative for upper GI symptoms  Gastrointestinal (lower) : Negative for lower GI symptoms  Constitutional : Weight loss  Skin: Negative for skin symptoms  Eyes: Double vision  Ear/Nose/Throat : Negative for Ear/Nose/Throat symptoms  Hematologic/Lymphatic: Easy bruising  Cardiovascular : Negative for cardiovascular symptoms  Respiratory : Cough  Endocrine: Negative for endocrine symptoms  Musculoskeletal: Negative for musculoskeletal symptoms  Neurological: Negative for neurological symptoms  Psychologic: Negative for psychiatric symptoms

## 2020-11-16 NOTE — Progress Notes (Signed)
Subjective:  1. BPH with urinary obstruction   2. Nocturia   3. Microhematuria     Hayden Davis returns today in f/u.  He has a history of BPH with BOO and hematuria.  He remains on finasteride and has very rare bleeding but his UA has 3-10 RBC's today.  He continues to have some LUTS with some hesitancy in the mornings.  His IPSS is 11  He has a reduced stream and nocturia x 1.  He had a renal US that showed renal cortical atrophy and a 164m prostate with intravesical extension.  His weight is coming back up some after a decline.  He lost his wife of 664years earlier this year.   IPSS     Row Name 11/16/20 1300         International Prostate Symptom Score   How often have you had the sensation of not emptying your bladder? Less than half the time     How often have you had to urinate less than every two hours? Less than half the time     How often have you found you stopped and started again several times when you urinated? Less than half the time     How often have you found it difficult to postpone urination? Less than 1 in 5 times     How often have you had a weak urinary stream? Less than half the time     How often have you had to strain to start urination? Not at All     How many times did you typically get up at night to urinate? 1 Time     Total IPSS Score 10           Quality of Life due to urinary symptoms     If you were to spend the rest of your life with your urinary condition just the way it is now how would you feel about that? Mostly Satisfied              ROS:  Review of Systems  All other systems reviewed and are negative.  No Known Allergies  Outpatient Encounter Medications as of 11/16/2020  Medication Sig   finasteride (PROSCAR) 5 MG tablet Take 1 tablet (5 mg total) by mouth daily.   levothyroxine (SYNTHROID) 75 MCG tablet    pantoprazole (PROTONIX) 40 MG tablet Take by mouth.   [DISCONTINUED] finasteride (PROSCAR) 5 MG tablet Take 1 tablet (5 mg  total) by mouth daily.   No facility-administered encounter medications on file as of 11/16/2020.     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/2016   Left Upper Arm (curet, cautery and 5FU)   SCCA (squamous cell carcinoma) of skin 11/18/2016   Right Hand(in situ) (curet, cautery and 5FU)   SCCA (squamous cell carcinoma) of skin 11/18/2016   Right Ear Rim(well diff) (curet, cautery and 5FU)   Squamous cell  carcinoma of skin 07/28/2019   atypical proli-left superior nasal sidewall(Cx35FU)    Past Surgical History:  Procedure Laterality Date   APPENDECTOMY      Social History   Socioeconomic History   Marital status: Married    Spouse name: Not on file   Number of children: Not on file   Years of education: Not on file   Highest education level: Not on file  Occupational History   Not on file  Tobacco Use   Smoking status: Never   Smokeless tobacco: Never  Vaping Use   Vaping Use: Never used  Substance and Sexual Activity   Alcohol use: Yes    Comment: occ   Drug use: No   Sexual activity: Not on file  Other Topics Concern   Not on file  Social History Narrative   Not on file   Social Determinants of Health   Financial Resource Strain: Not on file  Food Insecurity: Not on file  Transportation Needs: Not on file  Physical Activity: Not on file  Stress: Not on file  Social Connections: Not on file  Intimate Partner Violence: Not on file    History  reviewed. No pertinent family history.     Objective: Vitals:   11/16/20 1300  BP: (!) 144/67  Pulse: 75  Temp: 98 F (36.7 C)     Physical Exam  Lab Results:  No results found for this or any previous visit (from the past 24 hour(s)).   BMET No results for input(s): NA, K, CL, CO2, GLUCOSE, BUN, CREATININE, CALCIUM in the last 72 hours. PSA   UA has 3-10 RBC's.     Studies/Results:     Assessment & Plan: BPH with BOO with gross hematuria:   The gross hematuria has resolved on finasteride but he has 3-10 RBC's which is stable.     He is content with his voiding symptoms at this time.  He has ncoturia and a reduced stream with frequency.    Meds ordered this encounter  Medications   finasteride (PROSCAR) 5 MG tablet    Sig: Take 1 tablet (5 mg total) by mouth daily.    Dispense:  90 tablet    Refill:  3     Orders Placed This Encounter  Procedures   Urinalysis, Routine w reflex microscopic      Return in about 1 year (around 11/16/2021).   CC: Hayden Noon, MD  And Dr. Jeneen Rinks Davis.     Hayden Davis 11/16/2020

## 2020-11-17 ENCOUNTER — Ambulatory Visit: Payer: Medicare HMO | Admitting: Urology

## 2020-12-28 ENCOUNTER — Other Ambulatory Visit: Payer: Self-pay | Admitting: Urology

## 2020-12-28 DIAGNOSIS — N138 Other obstructive and reflux uropathy: Secondary | ICD-10-CM

## 2020-12-28 DIAGNOSIS — N401 Enlarged prostate with lower urinary tract symptoms: Secondary | ICD-10-CM

## 2021-10-05 ENCOUNTER — Ambulatory Visit (INDEPENDENT_AMBULATORY_CARE_PROVIDER_SITE_OTHER): Payer: Medicare HMO | Admitting: Physician Assistant

## 2021-10-05 VITALS — BP 116/71 | HR 76 | Ht 71.0 in | Wt 145.0 lb

## 2021-10-05 DIAGNOSIS — R310 Gross hematuria: Secondary | ICD-10-CM

## 2021-10-05 DIAGNOSIS — R319 Hematuria, unspecified: Secondary | ICD-10-CM | POA: Diagnosis not present

## 2021-10-05 DIAGNOSIS — S30861A Insect bite (nonvenomous) of abdominal wall, initial encounter: Secondary | ICD-10-CM | POA: Diagnosis not present

## 2021-10-05 DIAGNOSIS — W57XXXA Bitten or stung by nonvenomous insect and other nonvenomous arthropods, initial encounter: Secondary | ICD-10-CM

## 2021-10-05 LAB — MICROSCOPIC EXAMINATION: Epithelial Cells (non renal): NONE SEEN /hpf (ref 0–10)

## 2021-10-05 LAB — URINALYSIS, ROUTINE W REFLEX MICROSCOPIC
Bilirubin, UA: NEGATIVE
Glucose, UA: NEGATIVE
Ketones, UA: NEGATIVE
Leukocytes,UA: NEGATIVE
Nitrite, UA: NEGATIVE
Protein,UA: NEGATIVE
Specific Gravity, UA: 1.015 (ref 1.005–1.030)
Urobilinogen, Ur: 0.2 mg/dL (ref 0.2–1.0)
pH, UA: 5.5 (ref 5.0–7.5)

## 2021-10-05 LAB — BLADDER SCAN AMB NON-IMAGING: Scan Result: 163

## 2021-10-05 MED ORDER — DOXYCYCLINE HYCLATE 100 MG PO CAPS: 100.0000 mg | ORAL_CAPSULE | Freq: Two times a day (BID) | ORAL | 0 refills | Status: DC

## 2021-10-05 NOTE — Progress Notes (Signed)
Assessment: 1. Hematuria, unspecified type - Urinalysis, Routine w reflex microscopic - BLADDER SCAN AMB NON-IMAGING  2. Tick bite of abdomen, initial encounter  3. Gross hematuria    Plan: Doxycycline twice daily for 10 days prescribed for prophylaxis of tickborne illnesses.  We will discuss patient's presentation of gross hematuria with Dr. Jeffie Pollock next week and will proceed with hematuria protocol if indicated given pt's age.  Chief Complaint: No chief complaint on file.   HPI: Hayden Davis is a 88 y.o. male who presents for evaluation of 2 episodes of gross hematuria over the past few months with no sxs of pain, dysuria, burning, urgency or frequency.  No feelings of incomplete emptying.  He does complain of his urine smelling very strong for the past few days.  Symptoms of gross hematuria were isolated and resolved spontaneously.  The patient denies history of hematuria in the past and renal ultrasound dated 03/16/2019 indicated no evidence of renal mass, but patient was noted to have a lobulated enlarged prostate with invasion into the posterior bladder. Patient also complains of tick bite he sustained to his lower abdomen 2 days ago.  No rashes, fever, chills, body aches. PVR = 190m UA = 0-5 WBCs, rare bacteria, nitrite negative  11/16/20 Mr. CRoylancereturns today in f/u.  He has a history of BPH with BOO and hematuria.  He remains on finasteride and has very rare bleeding but his UA has 3-10 RBC's today.  He continues to have some LUTS with some hesitancy in the mornings.  His IPSS is 11  He has a reduced stream and nocturia x 1.  He had a renal [place]oreathat showed renal cortical atrophy and a 1961mprostate with intravesical extension.  His weight is coming back up some after a decline.  He lost his wife of 6[address]ears earlier this year.   Portions of the above documentation were copied from a prior visit for review purposes only.  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/2016   Left Upper Arm (curet, cautery and 5FU)   SCCA (squamous cell carcinoma) of skin 11/18/2016   Right Hand(in situ) (curet, cautery and 5FU)   SCCA (squamous cell carcinoma) of skin 11/18/2016   Right Ear Rim(well diff) (curet, cautery and 5FU)   Squamous cell carcinoma of skin 07/28/2019   atypical proli-left superior nasal sidewall(Cx35FU)    PSH: Past Surgical History:  Procedure Laterality Date   APPENDECTOMY      SH: Social History   Tobacco Use   Smoking status: Never   Smokeless tobacco: Never  Vaping Use   Vaping Use: Never used  Substance Use Topics   Alcohol use: Yes    Comment: occ   Drug use: No    ROS: All other review of systems were reviewed and are negative except what is noted above in HPI  PE: BP 116/71  Pulse 76   Ht '5\' 11"'$  (1.803 m)   Wt 145 lb (65.8 kg)   BMI 20.22 kg/m  GENERAL APPEARANCE:  Well appearing, well developed, well nourished, NAD HEENT:  Atraumatic, normocephalic NECK:  Supple. Trachea midline ABDOMEN:  Soft, non-tender, no masses EXTREMITIES:  Moves all extremities well, without clubbing, cyanosis, or edema NEUROLOGIC:  Alert and oriented x 3, normal gait, CN II-XII grossly intact MENTAL STATUS:  appropriate BACK:  Non-tender to palpation, No CVAT SKIN:  Warm, dry, and intact.  3 mm erythematous lesion noted on the right lower abdomen with punctate eschar centrally.  Finding is  consistent with tick removal as described by the patient.   Results: Laboratory Data: Lab Results  Component Value Date   WBC 8.6 04/29/2015   HGB 14.0 04/29/2015   HCT 43.0 04/29/2015   MCV 98.2 04/29/2015   PLT 125 (L) 04/29/2015    Lab Results  Component Value Date   CREATININE 1.45 (H) 04/29/2015    No results found for: "PSA"  No results found for: "TESTOSTERONE"  No results found for: "HGBA1C"  Urinalysis    Component Value Date/Time   APPEARANCEUR Clear 11/16/2020 1310   GLUCOSEU Negative 11/16/2020 1310   BILIRUBINUR Negative 11/16/2020 1310   PROTEINUR Negative 11/16/2020 1310   UROBILINOGEN negative (A) 05/07/2019 1105   NITRITE Negative 11/16/2020 1310   LEUKOCYTESUR Negative 11/16/2020 1310    Lab Results  Component Value Date   LABMICR See below: 11/16/2020   WBCUA None seen 11/16/2020   LABEPIT None seen 11/16/2020   MUCUS Present 11/16/2020   BACTERIA Few (A) 11/16/2020    Pertinent Imaging: No results found for this or any previous visit.  No results found for this or any previous visit.  No results found for this or any previous visit.  No results found for this or any previous visit.  Results for orders placed during the hospital encounter of 03/16/19  US RENAL  Narrative CLINICAL DATA:  Gross hematuria  EXAM: RENAL / URINARY TRACT ULTRASOUND COMPLETE  COMPARISON:  Ultrasound 11/28/2004  FINDINGS: Right Kidney:  Renal measurements: 9.4 x 4.7 x 5.8 cm = volume: 135 mL. Renal cortical thinning is present. No hydronephrosis. Possible small stone within the upper pole measuring 4 mm.  Left Kidney:  Renal measurements: 10.9 x 4.8 x 5.5 cm = volume: 156 mL. Renal cortical thinning is present. No hydronephrosis or mass.  Bladder:  Appears normal for degree of bladder distention.  Other:  Lobulated enlarged prostate measuring 8.2 x 6.1 x 7.4 cm with volume of 196 mL, increased compared to prior. Lobulated  projection extending to the posterior bladder.  IMPRESSION: 1. Renal cortical thinning without evidence for kidney mass. Possible small stone in the upper pole of the right kidney. 2. Lobulated heterogeneous enlarged prostate with masslike projection indenting or invading the posterior bladder. This has increased in size as compared with 2006 ultrasound.   Electronically Signed By: Donavan Foil M.D. On: 03/16/2019 17:59  No results found for this or any previous visit.  No results found for this or any previous visit.  No results found for this or any previous visit.  No results found for this or any previous visit (from the past 24 hour(s)).

## 2021-10-05 NOTE — Progress Notes (Signed)
post void residual =177m

## 2021-11-01 ENCOUNTER — Ambulatory Visit: Payer: Medicare HMO | Admitting: Urology

## 2021-11-01 VITALS — BP 134/65 | HR 77

## 2021-11-01 DIAGNOSIS — R310 Gross hematuria: Secondary | ICD-10-CM

## 2021-11-01 DIAGNOSIS — R351 Nocturia: Secondary | ICD-10-CM | POA: Diagnosis not present

## 2021-11-01 DIAGNOSIS — R319 Hematuria, unspecified: Secondary | ICD-10-CM

## 2021-11-01 DIAGNOSIS — N401 Enlarged prostate with lower urinary tract symptoms: Secondary | ICD-10-CM

## 2021-11-01 DIAGNOSIS — N138 Other obstructive and reflux uropathy: Secondary | ICD-10-CM

## 2021-11-01 LAB — URINALYSIS, ROUTINE W REFLEX MICROSCOPIC
Bilirubin, UA: NEGATIVE
Glucose, UA: NEGATIVE
Ketones, UA: NEGATIVE
Leukocytes,UA: NEGATIVE
Nitrite, UA: NEGATIVE
Protein,UA: NEGATIVE
Specific Gravity, UA: 1.025 (ref 1.005–1.030)
Urobilinogen, Ur: 0.2 mg/dL (ref 0.2–1.0)
pH, UA: 5 (ref 5.0–7.5)

## 2021-11-01 LAB — MICROSCOPIC EXAMINATION
Bacteria, UA: NONE SEEN
Epithelial Cells (non renal): NONE SEEN /hpf (ref 0–10)
RBC, Urine: NONE SEEN /hpf (ref 0–2)
Renal Epithel, UA: NONE SEEN /hpf
WBC, UA: NONE SEEN /hpf (ref 0–5)

## 2021-11-01 MED ORDER — FINASTERIDE 5 MG PO TABS: 5.0000 mg | ORAL_TABLET | Freq: Every day | ORAL | 3 refills | Status: DC

## 2021-11-01 MED ORDER — CIPROFLOXACIN HCL 500 MG PO TABS: 500.0000 mg | ORAL_TABLET | Freq: Once | ORAL | Status: DC

## 2021-11-01 NOTE — Progress Notes (Signed)
Subjective:  1. Gross hematuria   2. BPH with urinary obstruction   3. Nocturia     11/01/21: Mr. Hayden Davis returns today in f/u.  He was seen in June with a couple of episodes of hematuria and 3-10 RBC's on UA at that visit.  He was given doxycycline for a tick bite at that time.  He had cystoscopy on 05/07/19 that showed a large friable prostate.  He has a little blood in the urine about every 6 months.  His UA is clear today.  His IPSS is 6.    11/16/20: Mr. Hayden Davis returns today in f/u.  He has a history of BPH with BOO and hematuria.  He remains on finasteride and has very rare bleeding but his UA has 3-10 RBC's today.  He continues to have some LUTS with some hesitancy in the mornings.  His IPSS is 11  He has a reduced stream and nocturia x 1.  He had a renal US that showed renal cortical atrophy and a 160m prostate with intravesical extension.  His weight is coming back up some after a decline.  He lost his wife of 678years earlier this year.   IPSS     Row Name 11/01/21 1300         International Prostate Symptom Score   How often have you had the sensation of not emptying your bladder? Not at All     How often have you had to urinate less than every two hours? Less than half the time     How often have you found you stopped and started again several times when you urinated? Less than half the time     How often have you found it difficult to postpone urination? Not at All     How often have you had a weak urinary stream? Not at All     How often have you had to strain to start urination? Less than 1 in 5 times     How many times did you typically get up at night to urinate? 1 Time     Total IPSS Score 6       Quality of Life due to urinary symptoms   If you were to spend the rest of your life with your urinary condition just the way it is now how would you feel about that? Mostly Satisfied               ROS:  Review of Systems  Eyes:  Positive for double vision.  All  other systems reviewed and are negative.   No Known Allergies  Outpatient Encounter Medications as of 11/01/2021  Medication Sig   finasteride (PROSCAR) 5 MG tablet Take 1 tablet (5 mg total) by mouth daily.   fluticasone-salmeterol (ADVAIR) 250-50 MCG/ACT AEPB Inhale 1 puff into the lungs 2 (two) times daily.   levothyroxine (SYNTHROID) 100 MCG tablet Take 100 mcg by mouth daily.   levothyroxine (SYNTHROID) 75 MCG tablet    pantoprazole (PROTONIX) 40 MG tablet Take by mouth.   [DISCONTINUED] doxycycline (VIBRAMYCIN) 100 MG capsule Take 1 capsule (100 mg total) by mouth every 12 (twelve) hours. (Patient not taking: Reported on 11/01/2021)   [DISCONTINUED] finasteride (PROSCAR) 5 MG tablet TAKE 1 TABLET EVERY DAY   [DISCONTINUED] ciprofloxacin (CIPRO) tablet 500 mg    No facility-administered encounter medications on file as of 11/01/2021.     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   Left Upper Arm (curet, cautery and 5FU)   SCCA (squamous cell carcinoma) of skin 11/18/2016   Right Hand(in situ) (curet, cautery and 5FU)   SCCA (squamous cell carcinoma) of skin 11/18/2016   Right Ear Rim(well diff) (curet, cautery and 5FU)   Squamous cell carcinoma of skin 07/28/2019   atypical proli-left superior nasal sidewall(Cx35FU)    Past Surgical History:  Procedure Laterality Date   APPENDECTOMY      Social History   Socioeconomic History   Marital status: Married     Spouse name: Not on file   Number of children: Not on file   Years of education: Not on file   Highest education level: Not on file  Occupational History   Not on file  Tobacco Use   Smoking status: Never   Smokeless tobacco: Never  Vaping Use   Vaping Use: Never used  Substance and Sexual Activity   Alcohol use: Yes    Comment: occ   Drug use: No   Sexual activity: Not on file  Other Topics Concern   Not on file  Social History Narrative   Not on file   Social Determinants of Health   Financial Resource Strain: Not on file  Food Insecurity: Not on file  Transportation Needs: Not on file  Physical Activity: Not on file  Stress: Not on file  Social Connections: Not on file  Intimate Partner Violence: Not on file    No family history on file.     Objective: Vitals:   11/01/21 1329  BP: 134/65  Pulse: 77     Physical Exam Vitals reviewed.  Constitutional:      Appearance: Normal appearance.  Neurological:     Mental Status: He is alert.     Lab Results:  Results for orders placed or performed in visit on 11/01/21 (from the past 24 hour(s))  Urinalysis, Routine w reflex microscopic     Status: Abnormal   Collection Time: 11/01/21  1:31 PM  Result Value Ref Range   Specific Gravity, UA 1.025 1.005 - 1.030   pH, UA 5.0 5.0 - 7.5   Color, UA Yellow Yellow   Appearance Ur Clear Clear   Leukocytes,UA Negative Negative   Protein,UA Negative Negative/Trace   Glucose, UA Negative Negative   Ketones, UA Negative Negative   RBC, UA Trace (A) Negative   Bilirubin, UA Negative Negative   Urobilinogen, Ur 0.2 0.2 - 1.0 mg/dL   Nitrite, UA Negative Negative   Microscopic Examination See below:    Narrative   Performed at:  [address] [place] [id number] Director: LMina MarbleMT, Phone:  [phone] Microscopic Examination     Status: None   Collection Time: 11/01/21  1:31 PM   Urine  Result Value Ref Range    WBC, UA None seen 0 - 5 /hpf   RBC, Urine None seen 0 - 2 /hpf  Epithelial Cells (non renal) None seen 0 - 10 /hpf   Renal Epithel, UA None seen None seen /hpf   Bacteria, UA None seen None seen/Few   Narrative   Performed at:  [facility] [address], [place], [place]  [id number] Lab Director: Lilly, Phone:  [phone]     BMET No results for input(s): "NA", "K", "CL", "CO2", "GLUCOSE", "BUN", "CREATININE", "CALCIUM" in the last 72 hours. PSA   UA is clear.     Studies/Results:  Prior records reviewed.   Assessment & Plan: BPH with BOO with gross hematuria:   The gross hematuria occurs very rarely on finasteride and his UA is clear today.  He is voiding well on therapy.  but he has 3-10 RBC's which is stable.     He is content with his voiding symptoms at this time.  He has ncoturia and a reduced stream with frequency.    Meds ordered this encounter  Medications   DISCONTD: ciprofloxacin (CIPRO) tablet 500 mg   finasteride (PROSCAR) 5 MG tablet    Sig: Take 1 tablet (5 mg total) by mouth daily.    Dispense:  90 tablet    Refill:  3     Orders Placed This Encounter  Procedures   Microscopic Examination   Urinalysis, Routine w reflex microscopic      Return in about 1 year (around 11/02/2022).   CC: Chesley Noon, MD  And Dr. Jeneen Rinks Deterding.     [name] 11/02/2021

## 2021-11-15 ENCOUNTER — Ambulatory Visit: Payer: Medicare HMO | Admitting: Urology

## 2022-01-12 ENCOUNTER — Other Ambulatory Visit: Payer: Self-pay | Admitting: Urology

## 2022-01-12 DIAGNOSIS — N138 Other obstructive and reflux uropathy: Secondary | ICD-10-CM

## 2022-03-18 ENCOUNTER — Other Ambulatory Visit: Payer: Self-pay

## 2022-03-18 ENCOUNTER — Emergency Department (HOSPITAL_COMMUNITY)
Admission: EM | Admit: 2022-03-18 | Discharge: 2022-03-19 | Disposition: A | Discharge status: Home / Self Care | Payer: Medicare HMO | Attending: Emergency Medicine | Admitting: Emergency Medicine

## 2022-03-18 ENCOUNTER — Emergency Department (HOSPITAL_COMMUNITY): Payer: Medicare HMO

## 2022-03-18 ENCOUNTER — Encounter (HOSPITAL_COMMUNITY): Payer: Self-pay

## 2022-03-18 ENCOUNTER — Encounter: Payer: Self-pay | Admitting: Internal Medicine

## 2022-03-18 DIAGNOSIS — Y92019 Unspecified place in single-family (private) house as the place of occurrence of the external cause: Secondary | ICD-10-CM | POA: Diagnosis not present

## 2022-03-18 DIAGNOSIS — M549 Dorsalgia, unspecified: Secondary | ICD-10-CM | POA: Diagnosis not present

## 2022-03-18 DIAGNOSIS — S0990XA Unspecified injury of head, initial encounter: Secondary | ICD-10-CM | POA: Diagnosis present

## 2022-03-18 DIAGNOSIS — S0101XA Laceration without foreign body of scalp, initial encounter: Secondary | ICD-10-CM | POA: Insufficient documentation

## 2022-03-18 DIAGNOSIS — I700 Atherosclerosis of aorta: Secondary | ICD-10-CM | POA: Insufficient documentation

## 2022-03-18 DIAGNOSIS — W109XXA Fall (on) (from) unspecified stairs and steps, initial encounter: Secondary | ICD-10-CM | POA: Diagnosis not present

## 2022-03-18 DIAGNOSIS — M25551 Pain in right hip: Secondary | ICD-10-CM | POA: Insufficient documentation

## 2022-03-18 DIAGNOSIS — R531 Weakness: Secondary | ICD-10-CM

## 2022-03-18 DIAGNOSIS — I6782 Cerebral ischemia: Secondary | ICD-10-CM | POA: Diagnosis not present

## 2022-03-18 DIAGNOSIS — I629 Nontraumatic intracranial hemorrhage, unspecified: Secondary | ICD-10-CM

## 2022-03-18 LAB — CBC
HCT: 41.1 % (ref 39.0–52.0)
Hemoglobin: 13.1 g/dL (ref 13.0–17.0)
MCH: 30.3 pg (ref 26.0–34.0)
MCHC: 31.9 g/dL (ref 30.0–36.0)
MCV: 94.9 fL (ref 80.0–100.0)
Platelets: 177 10*3/uL (ref 150–400)
RBC: 4.33 MIL/uL (ref 4.22–5.81)
RDW: 12.8 % (ref 11.5–15.5)
WBC: 9.8 10*3/uL (ref 4.0–10.5)
nRBC: 0 % (ref 0.0–0.2)

## 2022-03-18 LAB — PROTIME-INR
INR: 1 (ref 0.8–1.2)
Prothrombin Time: 13.2 seconds (ref 11.4–15.2)

## 2022-03-18 LAB — BASIC METABOLIC PANEL
Anion gap: 7 (ref 5–15)
BUN: 21 mg/dL (ref 8–23)
CO2: 25 mmol/L (ref 22–32)
Calcium: 9.1 mg/dL (ref 8.9–10.3)
Chloride: 107 mmol/L (ref 98–111)
Creatinine, Ser: 1.2 mg/dL (ref 0.61–1.24)
GFR, Estimated: 54 mL/min — ABNORMAL LOW (ref >60)
Glucose, Bld: 119 mg/dL — ABNORMAL HIGH (ref 70–99)
Potassium: 4.1 mmol/L (ref 3.5–5.1)
Sodium: 139 mmol/L (ref 135–145)

## 2022-03-18 MED ORDER — LEVOTHYROXINE SODIUM 50 MCG PO TABS
100.0000 ug | ORAL_TABLET | Freq: Every day | ORAL | Status: DC
  Administered 2022-03-19: 100 ug via ORAL
  Filled 2022-03-18: qty 2

## 2022-03-18 MED ORDER — FAMOTIDINE 20 MG PO TABS
10.0000 mg | ORAL_TABLET | Freq: Every day | ORAL | Status: DC
  Administered 2022-03-19: 10 mg via ORAL
  Filled 2022-03-18: qty 1

## 2022-03-18 MED ORDER — LEVOTHYROXINE SODIUM 50 MCG PO TABS: 100.0000 ug | ORAL_TABLET | Freq: Every day | ORAL | Status: DC

## 2022-03-18 MED ORDER — IBUPROFEN 800 MG PO TABS
800.0000 mg | ORAL_TABLET | Freq: Once | ORAL | Status: AC
  Administered 2022-03-18: 800 mg via ORAL
  Filled 2022-03-18: qty 1

## 2022-03-18 MED ORDER — ACETAMINOPHEN 500 MG PO TABS
1000.0000 mg | ORAL_TABLET | Freq: Once | ORAL | Status: AC
  Administered 2022-03-18: 1000 mg via ORAL
  Filled 2022-03-18: qty 2

## 2022-03-18 MED ORDER — FINASTERIDE 5 MG PO TABS
5.0000 mg | ORAL_TABLET | Freq: Every day | ORAL | Status: DC
  Administered 2022-03-19: 5 mg via ORAL
  Filled 2022-03-18: qty 1

## 2022-03-18 NOTE — ED Provider Notes (Signed)
[facility] EMERGENCY DEPARTMENT Provider Note   CSN: [id number] Arrival date & time: 03/18/22  1612     History {Add pertinent medical, surgical, social history, OB history to HPI:1} Chief Complaint  Patient presents with   Hayden Davis is a 88 y.o. male presenting to the emergency department with a fall down 6 stairs.  The patient lives independently at home, though his family members that occasionally stay with him.  He says he lost his footing top of the stairs and fell down about 6 steps.  He said he struck the back of his head, the back of his ribs, as well as having pain in the back of his right hip.  He denies pain in the arms or legs.  He presents by EMS.  He denies neck pain.  He is not on blood thinners.  HPI     Home Medications Prior to Admission medications   Medication Sig Start Date End Date Taking? Authorizing Provider  finasteride (PROSCAR) 5 MG tablet TAKE 1 TABLET EVERY DAY 01/13/22   [name], MD  fluticasone-salmeterol (ADVAIR) 250-50 MCG/ACT AEPB Inhale 1 puff into the lungs 2 (two) times daily. 09/29/21   [provider]  levothyroxine (SYNTHROID) 100 MCG tablet Take 100 mcg by mouth daily. 09/29/21   [provider]  levothyroxine (SYNTHROID) 75 MCG tablet  06/08/19   [provider]  pantoprazole (PROTONIX) 40 MG tablet Take by mouth. 03/30/19   [provider]      Allergies    Patient has no known allergies.    Review of Systems   Review of Systems  Physical Exam Updated Vital Signs BP (!) 124/93   Pulse 71   Temp 98.1 F (36.7 C)   Resp (!) 22   Ht 6' (1.829 m)   Wt 65.8 kg   SpO2 99%   BMI 19.67 kg/m  Physical Exam Constitutional:      General: He is not in acute distress.    Comments: Hard of hearing, frail habitus  HENT:     Head: Normocephalic.     Comments: Hematoma to left posterior scalp Eyes:     Conjunctiva/sclera: Conjunctivae normal.     Pupils: Pupils are equal, round, and  reactive to light.  Cardiovascular:     Rate and Rhythm: Normal rate and regular rhythm.  Pulmonary:     Effort: Pulmonary effort is normal. No respiratory distress.  Abdominal:     General: There is no distension.     Tenderness: There is no abdominal tenderness.  Musculoskeletal:     Comments: Right posterior rib tenderness, right posterior pelvic/iliac tenderness, full range of motion of the hips, shoulders and extremities with no deformities or tenderness noted. No spinal midline tenderness  Skin:    General: Skin is warm and dry.  Neurological:     General: No focal deficit present.     Mental Status: He is alert. Mental status is at baseline.  Psychiatric:        Mood and Affect: Mood normal.        Behavior: Behavior normal.     ED Results / Procedures / Treatments   Labs (all labs ordered are listed, but only abnormal results are displayed) Labs Reviewed - No data to display  EKG None  Radiology No results found.  Procedures Procedures  {Document cardiac monitor, telemetry assessment procedure when appropriate:1}  Medications Ordered in ED Medications  acetaminophen (TYLENOL) tablet 1,000 mg (has  no administration in time range)  ibuprofen (ADVIL) tablet 800 mg (has no administration in time range)    ED Course/ Medical Decision Making/ A&P                           Medical Decision Making Amount and/or Complexity of Data Reviewed Radiology: ordered.  Risk OTC drugs. Prescription drug management.   Patient is here with a mechanical fall down several steps.  Given his age and his frail body habitus, there is high clinical concern for traumatic fracture.  Based on the fact that he is having pain in his head with head injury, right posterior ribs, and also around the pelvis, I have ordered multiple CT scans.  This includes spinal imaging, as he appears to have significant pain with sitting up straight or moving in bed.  Motrin and ibuprofen ordered for  pain.  Supplemental history is provided by EMS on patient's arrival.  {Document critical care time when appropriate:1} {Document review of labs and clinical decision tools ie heart score, Chads2Vasc2 etc:1}  {Document your independent review of radiology images, and any outside records:1} {Document your discussion with family members, caretakers, and with consultants:1} {Document social determinants of health affecting pt's care:1} {Document your decision making why or why not admission, treatments were needed:1} Final Clinical Impression(s) / ED Diagnoses Final diagnoses:  None    Rx / DC Orders ED Discharge Orders     None

## 2022-03-18 NOTE — ED Triage Notes (Signed)
Pt lives alone and fell in the garage. Family came over and called [facility]. Pt has a hematoma on his head and has flank pain.

## 2022-03-19 NOTE — ED Provider Notes (Signed)
Patient evaluated by physical therapy patient evaluated by social worker case management.  Home health aide has been ordered physical therapy for home's been ordered and OT for home's been ordered.  According to social worker patient cleared for discharge home.   Fredia Sorrow, MD 03/19/22 1241
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
## 2022-03-19 NOTE — Evaluation (Signed)
Physical Therapy Evaluation Patient Details Name: Hayden Davis MRN: [id number] DOB: 1920-08-23 Today's Date: 03/19/2022  History of Present Illness  Hayden Davis is a 88 y.o. male presenting to the emergency department by EMS with a fall down 6 stairs.  The patient lives independently at home, though he occasionally stays with daughter/son-in-law in [place]. He says he lost his footing top of the stairs and fell down about 6 steps. He struck the back of his head, his ribs on the right side, and is having pain in the back of his right hip. CT (-) for acute fractures.   Clinical Impression  Patient demonstrates slightly labored movement for sitting up at bedside mostly due to c/o right flank pain. Patient able to stand and ambulate in hallway with RW and min guard for safety/fall risk with no loss of balance. Patient tolerated sitting up in chair after therapy with sister in room. Patient will benefit from continued skilled physical therapy in hospital and recommended venue below to increase strength, balance, endurance for safe ADLs and gait.     Recommendations for follow up therapy are one component of a multi-disciplinary discharge planning process, led by the attending physician.  Recommendations may be updated based on patient status, additional functional criteria and insurance authorization.  Follow Up Recommendations Home health PT      Assistance Recommended at Discharge Set up Supervision/Assistance  Patient can return home with the following  A little help with walking and/or transfers;A little help with bathing/dressing/bathroom;Assistance with cooking/housework;Help with stairs or ramp for entrance    Equipment Recommendations None recommended by PT  Recommendations for Other Services       Functional Status Assessment Patient has had a recent decline in their functional status and demonstrates the ability to make significant improvements in function in a reasonable and  predictable amount of time.     Precautions / Restrictions Precautions Precautions: Fall Restrictions Weight Bearing Restrictions: No      Mobility  Bed Mobility Overal bed mobility: Needs Assistance Bed Mobility: Supine to Sit     Supine to sit: Min assist, HOB elevated     General bed mobility comments: patient with slightly labored movement for sitting up due to c/o right flank pain, required HOB elevated and min assist to elevate trunk    Transfers Overall transfer level: Needs assistance Equipment used: Rolling walker (2 wheels) Transfers: Sit to/from Stand Sit to Stand: Min guard           General transfer comment: patient able to stand without assist with RW, min guard for safety and fall risk    Ambulation/Gait Ambulation/Gait assistance: Supervision, Min guard Gait Distance (Feet): 85 Feet Assistive device: Rolling walker (2 wheels) Gait Pattern/deviations: Decreased step length - right, Decreased step length - left, Decreased stride length Gait velocity: decreased     General Gait Details: patient able to ambulate with RW in hallway with no loss of balance, min guard/supervision for safety  Stairs            Wheelchair Mobility    Modified Rankin (Stroke Patients Only)       Balance Overall balance assessment: Needs assistance Sitting-balance support: Feet supported, No upper extremity supported Sitting balance-Leahy Scale: Good Sitting balance - Comments: seated EOB   Standing balance support: During functional activity, Bilateral upper extremity supported Standing balance-Leahy Scale: Fair Standing balance comment: fair/good with RW  Pertinent Vitals/Pain Pain Assessment Pain Assessment: Faces Faces Pain Scale: Hurts little more Pain Location: right flank Pain Descriptors / Indicators: Guarding, Grimacing Pain Intervention(s): Limited activity within patient's tolerance, Monitored during  session, Repositioned    Home Living Family/patient expects to be discharged to:: Private residence Living Arrangements: Alone;Children Available Help at Discharge: Available 24 hours/day;Family Type of Home: House Home Access: Stairs to enter Entrance Stairs-Rails: None Entrance Stairs-Number of Steps: 1   Home Layout: Multi-level;Able to live on main level with bedroom/bathroom Home Equipment: [place] Level (2 wheels) Additional Comments: Patient lives alone and with daughter in [place] off and on. Mostly drives to his home in [place] on the weekends for church.  Patient will now be living with daughter full time in [place].    Prior Function Prior Level of Function : Independent/Modified Independent;Driving             Mobility Comments: Community ambultor without AD, drives ADLs Comments: Independent     Hand Dominance        Extremity/Trunk Assessment   Upper Extremity Assessment Upper Extremity Assessment: Overall WFL for tasks assessed    Lower Extremity Assessment Lower Extremity Assessment: Overall WFL for tasks assessed    Cervical / Trunk Assessment Cervical / Trunk Assessment: Normal  Communication   Communication: No difficulties;HOH  Cognition Arousal/Alertness: Awake/alert Behavior During Therapy: WFL for tasks assessed/performed Overall Cognitive Status: Within Functional Limits for tasks assessed                                          General Comments      Exercises     Assessment/Plan    PT Assessment Patient needs continued PT services  PT Problem List Decreased strength;Decreased activity tolerance;Decreased balance;Decreased mobility       PT Treatment Interventions DME instruction;Gait training;Stair training;Functional mobility training;Therapeutic activities;Therapeutic exercise;Balance training;Patient/family education    PT Goals (Current goals can be found in the Care Plan section)  Acute Rehab PT  Goals Patient Stated Goal: return home with family to assist PT Goal Formulation: With patient/family Time For Goal Achievement: 03/26/22 Potential to Achieve Goals: Good    Frequency Min 2X/week     Co-evaluation               AM-PAC PT "6 Clicks" Mobility  Outcome Measure Help needed turning from your back to your side while in a flat bed without using bedrails?: A Little Help needed moving from lying on your back to sitting on the side of a flat bed without using bedrails?: A Little Help needed moving to and from a bed to a chair (including a wheelchair)?: A Little Help needed standing up from a chair using your arms (e.g., wheelchair or bedside chair)?: None Help needed to walk in hospital room?: None Help needed climbing 3-5 steps with a railing? : A Lot 6 Click Score: 19    End of Session   Activity Tolerance: Patient tolerated treatment well Patient left: in chair;with family/visitor present;with call bell/phone within reach Nurse Communication: Mobility status PT Visit Diagnosis: Unsteadiness on feet (R26.81);Muscle weakness (generalized) (M62.81);History of falling (Z91.81)    Time: 1601-0932 PT Time Calculation (min) (ACUTE ONLY): 25 min   Charges:   PT Evaluation $PT Eval Moderate Complexity: 1 Mod PT Treatments $Therapeutic Activity: 23-37 mins        Zigmund Gottron, SPT

## 2022-03-19 NOTE — Discharge Instructions (Addendum)
As per social worker home health aide physical therapy and Occupational Therapy is and ordered for home.  Cleared by physical therapy to use walker.  Keep scalp wound dry for another 24 hours.  Then can wash with soap and water.  Staple removal in 10 days.

## 2022-03-19 NOTE — Plan of Care (Signed)
  Problem: Acute Rehab PT Goals(only PT should resolve) Goal: Pt Will Go Supine/Side To Sit Outcome: Progressing Flowsheets (Taken 03/19/2022 1217) Pt will go Supine/Side to Sit: with min guard assist Goal: Patient Will Transfer Sit To/From Stand Outcome: Progressing Flowsheets (Taken 03/19/2022 1217) Patient will transfer sit to/from stand: with modified independence Goal: Pt Will Transfer Bed To Chair/Chair To Bed Outcome: Progressing Flowsheets (Taken 03/19/2022 1217) Pt will Transfer Bed to Chair/Chair to Bed: with supervision Goal: Pt Will Ambulate Outcome: Progressing Flowsheets (Taken 03/19/2022 1217) Pt will Ambulate:  with min guard assist  with supervision  > 125 feet  with least restrictive assistive device   [employer], SPT

## 2022-03-19 NOTE — ED Notes (Signed)
Breakfast tray given to patient.

## 2022-03-27 ENCOUNTER — Encounter: Payer: Self-pay | Admitting: Gastroenterology

## 2022-03-27 ENCOUNTER — Ambulatory Visit: Payer: Medicare HMO | Admitting: Gastroenterology

## 2022-03-27 VITALS — BP 113/65 | HR 81 | Temp 97.2°F | Ht 71.0 in | Wt 132.2 lb

## 2022-03-27 DIAGNOSIS — R1319 Other dysphagia: Secondary | ICD-10-CM | POA: Diagnosis not present

## 2022-03-27 NOTE — Progress Notes (Signed)
GI Office Note    Referring Provider: Allison Quarry, [place]* Primary Care Physician:  Neysa Hotter  Primary Gastroenterologist: Garfield Cornea, MD    Chief Complaint   Chief Complaint  Patient presents with   New Patient (Initial Visit)    Referred for Dysphagia      History of Present Illness   Hayden Davis is a 88 y.o. male presenting today at the request of Gwinda Passe, PA-C for dysphagia.  Patient presents with daughter Hayden Davis today. Patient started having problems swallowing a couple of months ago. No problems with liquids. Certain solids seem to stick in the chest. Breads are the worse. Moist foods usually don't present with any problems. He has been on Pepcid for years. He denies heartburn, vomiting, abdominal pain. BM regular. No melena, brbpr. His appetite is diminished since a recent fall. They have noted weight loss from 146 pounds in May to 132 pounds today. Typically eats a good BF and supper but snacks at lunch.   Recent observation in the ED after falling down 6 steps. Subdural hematomas and small hemorrhagic areas noted on CT. See below. Follows up with neurology tomorrow. No fractures noted on extensive CTs. He has pain in the right lateral chest wall with movement and coughing but states is is gradually getting better.   CT chest/abdomen/pelvis March 18, 2022: No acute posttraumatic abnormality, marked enlargement of the prostate, mild bladder wall thickening with diverticula possibly due to obstructive uropathy given size of prostate, colonic diverticulosis.  CT head March 18, 2022: No significant interval change in right convexity subdural hematoma measuring 3 mm.  Small volume subdural blood along the posterior falx similar to prior study.  Redemonstrated multifocal small hemorrhagic cortical contusions and small volume subarachnoid blood without significant change from CT prior day.  No new hemorrhagic foci.  No midline  shift.   Colonoscopy 2011: pancolonic diverticula. Mid sigmoid colon tubulovillous adenoma, ascending colon tubular adenoma.  EGD 2007: patulous EGJ, small hh  Medications   Current Outpatient Medications  Medication Sig Dispense Refill   famotidine (PEPCID) 10 MG tablet Take 10 mg by mouth daily.     finasteride (PROSCAR) 5 MG tablet TAKE 1 TABLET EVERY DAY 90 tablet 3   levothyroxine (SYNTHROID) 100 MCG tablet Take 100 mcg by mouth daily.     No current facility-administered medications for this visit.    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 No pertinent family history.  Past Social History   Social History   Socioeconomic History   Marital status: Married    Spouse name: Not on file   Number of children: Not on file    Years of education: Not on file   Highest education level: Not on file  Occupational History   Not on file  Tobacco Use   Smoking status: Never   Smokeless tobacco: Never  Vaping Use   Vaping Use: Never used  Substance and Sexual Activity   Alcohol use: Yes    Comment: occ   Drug use: No   Sexual activity: Not on file  Other Topics Concern   Not on file  Social History Narrative   Not on file   Social Determinants of Health   Financial Resource Strain: Not on file  Food Insecurity: Not on file  Transportation Needs: Not on file  Physical Activity: Not on file  Stress: Not on file  Social Connections: Not on file  Intimate Partner Violence: Not on file    Review of Systems   General: Negative for  fever, chills, fatigue, weakness. See hpi Eyes: Negative for vision changes.  ENT: Negative for hoarseness,  nasal congestion. See hpi CV: Negative for chest pain, angina, palpitations, dyspnea on exertion, peripheral edema. See hpi Respiratory: Negative for dyspnea at rest, dyspnea on exertion, cough, sputum, wheezing. See hpi GI: See history of present illness. GU:  Negative for dysuria, hematuria, urinary incontinence, urinary frequency, nocturnal urination.  MS: Negative for joint pain, low back pain.  Derm: Negative for rash or itching.  Neuro: Negative for weakness, abnormal sensation, seizure, frequent headaches, memory loss,  confusion.  Psych: Negative for anxiety, depression, suicidal ideation, hallucinations.  Endo: Negative for unusual weight change.  Heme: Negative for bruising or bleeding. Allergy: Negative for rash or hives.  Physical Exam   BP 113/65   Pulse 81   Temp (!) 97.2 F (36.2 C)   Ht '5\' 11"'$  (1.803 m)   Wt 132 lb 3.2 oz (60 kg)   BMI 18.44 kg/m    General: Well-nourished, well-developed in no acute distress.  Head: Normocephalic, atraumatic.   Eyes: Conjunctiva pink, no icterus. Mouth: Oropharyngeal mucosa moist and pink , no lesions  erythema or exudate. Neck: Supple without thyromegaly, masses, or lymphadenopathy.  Lungs: Clear to auscultation bilaterally. Tenderness with palpation of right anterior/lateral lower chest wall. No bony abnormalities noted. No bruising noted.  Heart: Regular rate and rhythm, no murmurs rubs or gallops.  Abdomen: Bowel sounds are normal, nontender, nondistended, no hepatosplenomegaly or masses,  no abdominal bruits or hernia, no rebound or guarding.   Rectal: not performed Extremities: No lower extremity edema. No clubbing or deformities.  Neuro: Alert and oriented x 4 , grossly normal neurologically.  Skin: Warm and dry, no rash or jaundice.   Psych: Alert and cooperative, normal mood and affect.  Labs   Lab Results  Component Value Date   CREATININE 1.20 03/18/2022   BUN 21 03/18/2022   NA 139 03/18/2022   K 4.1 03/18/2022   CL 107 03/18/2022   CO2 25 03/18/2022   Lab Results  Component Value Date   WBC 9.8 03/18/2022   HGB 13.1 03/18/2022  HCT 41.1 03/18/2022   MCV 94.9 03/18/2022   PLT 177 03/18/2022   Lab Results  Component Value Date   INR 1.0 03/18/2022    Imaging Studies   CT Head Wo Contrast  Result Date: 03/19/2022 CLINICAL DATA:  Follow-up subdural EXAM: CT HEAD WITHOUT CONTRAST TECHNIQUE: Contiguous axial images were obtained from the base of the skull through the vertex without intravenous contrast. RADIATION DOSE REDUCTION: This exam was performed according to the departmental dose-optimization program which includes automated exposure control, adjustment of the mA and/or kV according to patient size and/or use of iterative reconstruction technique. COMPARISON:  Head CT 03/18/2022 FINDINGS: Brain: No acute territorial infarction or intracranial mass is visualized. Thin right convexity subdural hematoma measuring 3 mm maximum thickness. Small volume subdural blood along the posterior falx, series 2, image 20 similar compared to prior. Multifocal small cortical  hemorrhagic contusion versus small volume subarachnoid blood at the right cranial vertex, series 2, image 26, left anterior frontal lobe, series 2, image 22, right anterior temporal lobe, series 2 image 11 and right inferior frontal lobe, coronal series 4, image 15. Small hemorrhagic contusion left parietal lobe, series 2, image 19, unchanged. No midline shift. Atrophy and chronic small vessel ischemic changes of the white matter. Stable ventricle size. Chronic cerebellar infarcts. Vascular: No hyperdense vessels.  No unexpected calcification Skull: Normal. Negative for fracture or focal lesion. Sinuses/Orbits: No acute finding. Other: Moderate left posterior parietal scalp hematoma with staples. IMPRESSION: 1. No significant interval change in thin right convexity subdural hematoma measuring 3 mm maximum thickness. Small volume subdural blood along the posterior falx similar compared to prior. 2. Redemonstrated multifocal small hemorrhagic cortical contusions and small volume subarachnoid blood as described above without significant change since head CT performed yesterday. There is no new hemorrhagic focus identified. There is no midline shift. 3. Atrophy and chronic small vessel ischemic changes of the white matter. Electronically Signed   By: Donavan Foil M.D.   On: 03/19/2022 00:32   CT T-SPINE NO CHARGE  Result Date: 03/18/2022 CLINICAL DATA:  Fall, hematoma along the head. EXAM: CT THORACIC SPINE WITHOUT CONTRAST TECHNIQUE: Multidetector CT images of the thoracic were obtained using the standard protocol without intravenous contrast. RADIATION DOSE REDUCTION: This exam was performed according to the departmental dose-optimization program which includes automated exposure control, adjustment of the mA and/or kV according to patient size and/or use of iterative reconstruction technique. COMPARISON:  CT chest 03/18/2022 FINDINGS: Alignment: No vertebral subluxation is observed. Vertebrae: Multilevel  bridging spurring anterior to the vertebral body column. Paraspinal and other soft tissues: See dedicated chest CT report. Disc levels: No impingement identified. IMPRESSION: 1. No acute thoracic spine findings. 2. Multilevel bridging spurring anterior to the vertebral body column. Electronically Signed   By: Van Clines M.D.   On: 03/18/2022 18:43   CT CHEST ABDOMEN PELVIS WO CONTRAST  Result Date: 03/18/2022 CLINICAL DATA:  Fall down 6 stairs with posterior scalp hematoma and right lower rib tenderness and right posterior pelvic tenderness EXAM: CT CHEST, ABDOMEN AND PELVIS WITHOUT CONTRAST TECHNIQUE: Multidetector CT imaging of the chest, abdomen and pelvis was performed following the standard protocol without IV contrast. RADIATION DOSE REDUCTION: This exam was performed according to the departmental dose-optimization program which includes automated exposure control, adjustment of the mA and/or kV according to patient size and/or use of iterative reconstruction technique. COMPARISON:  CT chest 04/25/2017 FINDINGS: CT CHEST FINDINGS Cardiovascular: Coronary artery and mitral annular calcification. No pericardial effusion. Aortic calcification. Mediastinum/Nodes:  No thoracic adenopathy by size. Unremarkable esophagus. Lungs/Pleura: No pneumothorax or pleural effusion. Bibasilar atelectasis/scarring. Central airways are patent. Musculoskeletal: No acute fracture. CT ABDOMEN PELVIS FINDINGS Hepatobiliary: No focal liver abnormality is seen. No gallstones, gallbladder wall thickening, or biliary dilatation. Pancreas: Unremarkable. No pancreatic ductal dilatation or surrounding inflammatory changes. Spleen: No splenic injury or perisplenic hematoma. Adrenals/Urinary Tract: No adrenal or renal injury identified. Mild bladder wall thickening and diverticula. Stomach/Bowel: Stomach is within normal limits. No evidence of bowel wall thickening, distention, or inflammatory changes. Colonic diverticulosis  without diverticulitis. Vascular/Lymphatic: Aortic atherosclerosis. No enlarged abdominal or pelvic lymph nodes. Reproductive: Marked prostate enlargement. Other: Mild mesenteric edema.  No free intraperitoneal air. Musculoskeletal: No acute fracture.  Body wall edema IMPRESSION: No acute posttraumatic abnormality in the chest, abdomen, or pelvis. Coronary artery and Aortic Atherosclerosis (ICD10-I70.0). Marked enlargement of the prostate. Mild bladder wall irregularity with diverticula possibly due to obstructive uropathy given size of prostate. Colonic diverticulosis without diverticulitis. Electronically Signed   By: Placido Sou M.D.   On: 03/18/2022 18:42   CT L-SPINE NO CHARGE  Result Date: 03/18/2022 CLINICAL DATA:  Fall down stairs, back pain EXAM: CT LUMBAR SPINE WITHOUT CONTRAST TECHNIQUE: Multidetector CT imaging of the lumbar spine was performed without intravenous contrast administration. Multiplanar CT image reconstructions were also generated. RADIATION DOSE REDUCTION: This exam was performed according to the departmental dose-optimization program which includes automated exposure control, adjustment of the mA and/or kV according to patient size and/or use of iterative reconstruction technique. COMPARISON:  None Available. FINDINGS: Segmentation: The lowest lumbar type non-rib-bearing vertebra is labeled as L5. Alignment: 3 mm degenerative retrolisthesis at L5-S1. Vertebrae: Bony demineralization. Multilevel Schmorl's nodes. No fracture or acute bony findings. Paraspinal and other soft tissues: See dedicated CT abdomen report Disc levels: L3-4: Suspected mild right foraminal stenosis due to facet arthropathy and disc bulge. L4-5: No impingement.  Intervertebral and facet spurring. L5-S1: Moderate left foraminal impingement due to facet arthropathy as on images 41-43 of series 7. IMPRESSION: 1. No acute lumbar spine findings. 2. Lumbar spondylosis and degenerative disc disease causing moderate  left foraminal stenosis at L5-S1 and mild right foraminal stenosis at L3-4. 3. Bony demineralization. Electronically Signed   By: Van Clines M.D.   On: 03/18/2022 18:40   CT Head Wo Contrast  Result Date: 03/18/2022 CLINICAL DATA:  Trauma EXAM: CT HEAD WITHOUT CONTRAST CT CERVICAL SPINE WITHOUT CONTRAST TECHNIQUE: Multidetector CT imaging of the head and cervical spine was performed following the standard protocol without intravenous contrast. Multiplanar CT image reconstructions of the cervical spine were also generated. RADIATION DOSE REDUCTION: This exam was performed according to the departmental dose-optimization program which includes automated exposure control, adjustment of the mA and/or kV according to patient size and/or use of iterative reconstruction technique. COMPARISON:  CT Head 07/22/14 FINDINGS: CT HEAD FINDINGS Brain: No evidence of acute infarction, hydrocephalus, or mass lesion/mass effect. There is a 3 mm subdural hematoma along the right cerebral convexity, which is greatest in size along the right frontal convexity measuring up to 3 mm. Subdural hematoma is also seen along the falx (series 4, image 22). There is trace subarachnoid hemorrhage along the left temporal convexity (series 4, image 16). There is likely additional subarachnoid blood at the vertex (series 4, image 27). There is a small hemorrhagic contusion along the left parietal lobe (series 4, image 23) measuring up to 4 mm. There is an additional site of a hemorrhagic contusion along the right frontal lobe (series 4, image  14) and along the anterior right temporal lobe (series 4, image 10). 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 Vascular: No hyperdense vessel or unexpected calcification. Skull: Soft tissue hematoma along the left parietal scalp. No evidence of underlying calvarial fracture. Sinuses/Orbits: Bilateral lens replacements. Trace mucosal thickening bilateral maxillary sinuses. Other: None CT  CERVICAL SPINE FINDINGS Alignment: Straightening of the normal cervical lordosis. Skull base and vertebrae: No acute fracture. No primary bone lesion or focal pathologic process. Soft tissues and spinal canal: No prevertebral fluid or swelling. No visible canal hematoma. Disc levels:  No evidence of high-grade spinal canal stenosis. Upper chest: See separately dictated CT chest 1abdomen and pelvis for additional findings. Other: None IMPRESSION: CT HEAD: 1. Subdural hematoma along the right cerebral convexity measuring up to 3 mm. Subdural hematoma along the falx. 2. Trace subarachnoid hemorrhage along the left temporal convexity and at the vertex. 3. Small hemorrhagic contusions along the left parietal lobe, right frontal lobe, and right temporal lobe. 4. Left parietal scalp hematoma. No evidence of underlying calvarial fracture. CT CERVICAL SPINE: No acute fracture or traumatic malalignment of the cervical spine. Electronically Signed   By: Marin Roberts M.D.   On: 03/18/2022 18:15   CT Cervical Spine Wo Contrast  Result Date: 03/18/2022 CLINICAL DATA:  Trauma EXAM: CT HEAD WITHOUT CONTRAST CT CERVICAL SPINE WITHOUT CONTRAST TECHNIQUE: Multidetector CT imaging of the head and cervical spine was performed following the standard protocol without intravenous contrast. Multiplanar CT image reconstructions of the cervical spine were also generated. RADIATION DOSE REDUCTION: This exam was performed according to the departmental dose-optimization program which includes automated exposure control, adjustment of the mA and/or kV according to patient size and/or use of iterative reconstruction technique. COMPARISON:  CT Head 07/22/14 FINDINGS: CT HEAD FINDINGS Brain: No evidence of acute infarction, hydrocephalus, or mass lesion/mass effect. There is a 3 mm subdural hematoma along the right cerebral convexity, which is greatest in size along the right frontal convexity measuring up to 3 mm. Subdural hematoma is also seen  along the falx (series 4, image 22). There is trace subarachnoid hemorrhage along the left temporal convexity (series 4, image 16). There is likely additional subarachnoid blood at the vertex (series 4, image 27). There is a small hemorrhagic contusion along the left parietal lobe (series 4, image 23) measuring up to 4 mm. There is an additional site of a hemorrhagic contusion along the right frontal lobe (series 4, image 14) and along the anterior right temporal lobe (series 4, image 10). 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 Vascular: No hyperdense vessel or unexpected calcification. Skull: Soft tissue hematoma along the left parietal scalp. No evidence of underlying calvarial fracture. Sinuses/Orbits: Bilateral lens replacements. Trace mucosal thickening bilateral maxillary sinuses. Other: None CT CERVICAL SPINE FINDINGS Alignment: Straightening of the normal cervical lordosis. Skull base and vertebrae: No acute fracture. No primary bone lesion or focal pathologic process. Soft tissues and spinal canal: No prevertebral fluid or swelling. No visible canal hematoma. Disc levels:  No evidence of high-grade spinal canal stenosis. Upper chest: See separately dictated CT chest 1abdomen and pelvis for additional findings. Other: None IMPRESSION: CT HEAD: 1. Subdural hematoma along the right cerebral convexity measuring up to 3 mm. Subdural hematoma along the falx. 2. Trace subarachnoid hemorrhage along the left temporal convexity and at the vertex. 3. Small hemorrhagic contusions along the left parietal lobe, right frontal lobe, and right temporal lobe. 4. Left parietal scalp hematoma. No evidence of underlying calvarial fracture. CT  CERVICAL SPINE: No acute fracture or traumatic malalignment of the cervical spine. Electronically Signed   By: Marin Roberts M.D.   On: 03/18/2022 18:15    Assessment   Dysphagia: suspected esophageal dysphagia, noted to solid foods. Would offer EGD to evaluate for  stricture with likely dilation. Given recent head trauma and pending neurology appointment tomorrow, we will hold off on scheduling until he has been evaluated. Daughter, Hayden Davis, will make neurology aware of plans for upcoming endoscopy.    PLAN   EGD/ED once evaluated by neurology and safe to proceed with anesthesia. ASA 2.  I have discussed the risks, alternatives, benefits with regards to but not limited to the risk of reaction to medication, bleeding, infection, perforation and the patient is agreeable to proceed. Written consent to be obtained. Patient to avoid hard, dry foods. Chew food thoroughly.    Laureen Ochs. Bobby Rumpf, [place], [facility]

## 2022-03-27 NOTE — Patient Instructions (Signed)
We recommend an upper endoscopy to evaluate your swallowing and potentially stretch your esophagus. Before scheduling, we recommend you follow up with the neurologist tomorrow and ask if okay to proceed with sedation for the endoscopy.

## 2022-04-30 ENCOUNTER — Ambulatory Visit: Payer: Medicare HMO | Admitting: Gastroenterology

## 2022-10-31 ENCOUNTER — Ambulatory Visit: Payer: Medicare HMO | Admitting: Urology

## 2022-11-07 ENCOUNTER — Ambulatory Visit: Payer: Medicare HMO | Admitting: Urology

## 2022-11-07 ENCOUNTER — Encounter: Payer: Self-pay | Admitting: Urology

## 2022-11-07 VITALS — BP 105/63 | HR 79 | Ht 71.0 in | Wt 122.0 lb

## 2022-11-07 DIAGNOSIS — N401 Enlarged prostate with lower urinary tract symptoms: Secondary | ICD-10-CM

## 2022-11-07 DIAGNOSIS — N138 Other obstructive and reflux uropathy: Secondary | ICD-10-CM

## 2022-11-07 DIAGNOSIS — R3129 Other microscopic hematuria: Secondary | ICD-10-CM

## 2022-11-07 DIAGNOSIS — R351 Nocturia: Secondary | ICD-10-CM | POA: Diagnosis not present

## 2022-11-07 LAB — URINALYSIS, ROUTINE W REFLEX MICROSCOPIC
Bilirubin, UA: NEGATIVE
Glucose, UA: NEGATIVE
Ketones, UA: NEGATIVE
Nitrite, UA: NEGATIVE
Protein,UA: NEGATIVE
Specific Gravity, UA: 1.02 (ref 1.005–1.030)
Urobilinogen, Ur: 0.2 mg/dL (ref 0.2–1.0)
pH, UA: 5.5 (ref 5.0–7.5)

## 2022-11-07 LAB — MICROSCOPIC EXAMINATION: Bacteria, UA: NONE SEEN

## 2022-11-07 MED ORDER — FINASTERIDE 5 MG PO TABS: 5.0000 mg | ORAL_TABLET | Freq: Every day | ORAL | 3 refills | Status: DC

## 2022-11-07 NOTE — Progress Notes (Signed)
Subjective:  1. BPH with urinary obstruction   2. Nocturia   3. Microhematuria     11/07/22: Hayden Davis returns today in f/u.   He has a history of hematuria that is felt to have been secondary to prostatic bleeding.  He has been on finasteride.  He has not had any recent bleeding.  His UA has 3-10 RBC's today.  He has mild voiding complaints with nocturia 1-2x.    11/01/21: Hayden Davis returns today in f/u.  He was seen in June with a couple of episodes of hematuria and 3-10 RBC's on UA at that visit.  He was given doxycycline for a tick bite at that time.  He had cystoscopy on 05/07/19 that showed a large friable prostate.  He has a little blood in the urine about every 6 months.  His UA is clear today.  His IPSS is 6.    11/16/20: Hayden Davis returns today in f/u.  He has a history of BPH with BOO and hematuria.  He remains on finasteride and has very rare bleeding but his UA has 3-10 RBC's today.  He continues to have some LUTS with some hesitancy in the mornings.  His IPSS is 11  He has a reduced stream and nocturia x 1.  He had a renal US that showed renal cortical atrophy and a prostate with intravesical extension.  His weight is coming back up some after a decline.  He lost his wife of 61 years earlier this year.       ROS:  Review of Systems  Psychiatric/Behavioral:  Positive for memory loss.   All other systems reviewed and are negative.   No Known Allergies  Outpatient Encounter Medications as of 11/07/2022  Medication Sig   famotidine (PEPCID) 10 MG tablet Take 10 mg by mouth daily.   levothyroxine (SYNTHROID) 100 MCG tablet Take 100 mcg by mouth daily.   [DISCONTINUED] finasteride (PROSCAR) 5 MG tablet TAKE 1 TABLET EVERY DAY   finasteride (PROSCAR) 5 MG tablet Take 1 tablet (5 mg total) by mouth daily.   No facility-administered encounter medications on file as of 11/07/2022.     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   Left Upper Arm (curet, cautery and 5FU)   SCCA (squamous cell carcinoma) of skin 11/18/2016   Right Hand(in situ) (curet, cautery and 5FU)   SCCA (squamous cell carcinoma) of skin 11/18/2016   Right Ear Rim(well diff) (curet, cautery and 5FU)   Squamous cell carcinoma of skin 07/28/2019   atypical proli-left superior nasal sidewall(Cx35FU)    Past Surgical History:  Procedure Laterality Date   APPENDECTOMY      Social History   Socioeconomic History   Marital status: Married    Spouse name: Not on file   Number of children: Not on file   Years of  education: Not on file   Highest education level: Not on file  Occupational History   Not on file  Tobacco Use   Smoking status: Never   Smokeless tobacco: Never  Vaping Use   Vaping status: Never Used  Substance and Sexual Activity   Alcohol use: Yes    Comment: occ   Drug use: No   Sexual activity: Not on file  Other Topics Concern   Not on file  Social History Narrative   Not on file   Social Determinants of Health   Financial Resource Strain: Low Risk  (09/03/2022)   Received from [facility]   Overall Financial Resource Strain (CARDIA)    Difficulty of Paying Living Expenses: Not hard at all  Food Insecurity: No Food Insecurity (09/03/2022)   Received from [facility]   Hunger Vital Sign    Worried About Running Out of Food in the Last Year: Never true    Ran Out of  Food in the Last Year: Never true  Transportation Needs: No Transportation Needs (09/03/2022)   Received from [facility] - Transportation    Lack of Transportation (Medical): No    Lack of Transportation (Non-Medical): No  Physical Activity: Insufficiently Active (09/03/2022)   Received from [facility]   Exercise Vital Sign    Days of Exercise per Week: 2 days    Minutes of Exercise per Session: 30 min  Stress: No Stress Concern Present (09/03/2022)   Received from [facility] - Occupational Stress Questionnaire    Feeling of Stress : Not at all  Social Connections: Moderately Integrated (09/03/2022)   Received from [facility]   Social Network    How would you rate your social network (family, work, friends)?: Adequate participation with social networks  Intimate Partner Violence: Not At Risk (09/03/2022)   Received from [facility]   HITS    Over the last 12 months how often did your partner physically hurt you?: 1    Over the last 12 months how often did your partner insult you or talk down to you?: 1    Over the last 12 months how often did your partner threaten you with physical harm?: 1    Over the last 12 months how often did your partner scream or curse at you?: 1    History reviewed. No pertinent family history.     Objective: Vitals:   11/07/22 1348  BP: 105/63  Pulse: 79     Physical Exam Vitals reviewed.  Constitutional:      Appearance: Normal appearance.  Neurological:     Mental Status: He is alert.     Lab Results:  No results found for this or any previous visit (from the past 24 hour(s)).    BMET No results for input(s): "NA", "K", "CL", "CO2", "GLUCOSE", "BUN", "CREATININE", "CALCIUM" in the last 72 hours. PSA   UA has 3-10 RBC's.     Studies/Results:  Prior records reviewed.   Assessment & Plan: BPH with BOO with gross hematuria:   The gross hematuria occurs very rarely  on finasteride and his UA is clear today.  He is voiding well on therapy.  but he has 3-10 RBC's which is stable.     He is content with his voiding symptoms at this time.  He has ncoturia and a reduced stream with frequency.    Meds ordered this encounter  Medications   finasteride (PROSCAR) 5  MG tablet    Sig: Take 1 tablet (5 mg total) by mouth daily.    Dispense:  90 tablet    Refill:  3     Orders Placed This Encounter  Procedures   Microscopic Examination   Urinalysis, Routine w reflex microscopic      Return in about 1 year (around 11/07/2023).   CC: Ernest Mallick, PA-C  And Dr. Fayrene Fearing Deterding.     Hayden Davis 11/08/2022

## 2023-03-26 ENCOUNTER — Other Ambulatory Visit: Payer: Self-pay | Admitting: Urology

## 2023-03-26 DIAGNOSIS — N138 Other obstructive and reflux uropathy: Secondary | ICD-10-CM

## 2023-11-06 ENCOUNTER — Ambulatory Visit: Admitting: Urology

## 2023-11-06 ENCOUNTER — Encounter: Payer: Self-pay | Admitting: Urology

## 2023-11-06 ENCOUNTER — Ambulatory Visit: Payer: Medicare HMO | Admitting: Urology

## 2023-11-06 VITALS — BP 115/62 | HR 75

## 2023-11-06 DIAGNOSIS — N138 Other obstructive and reflux uropathy: Secondary | ICD-10-CM | POA: Diagnosis not present

## 2023-11-06 DIAGNOSIS — L570 Actinic keratosis: Secondary | ICD-10-CM | POA: Insufficient documentation

## 2023-11-06 DIAGNOSIS — R4189 Other symptoms and signs involving cognitive functions and awareness: Secondary | ICD-10-CM | POA: Insufficient documentation

## 2023-11-06 DIAGNOSIS — B351 Tinea unguium: Secondary | ICD-10-CM | POA: Insufficient documentation

## 2023-11-06 DIAGNOSIS — H9190 Unspecified hearing loss, unspecified ear: Secondary | ICD-10-CM | POA: Insufficient documentation

## 2023-11-06 DIAGNOSIS — D234 Other benign neoplasm of skin of scalp and neck: Secondary | ICD-10-CM | POA: Insufficient documentation

## 2023-11-06 DIAGNOSIS — M720 Palmar fascial fibromatosis [Dupuytren]: Secondary | ICD-10-CM | POA: Insufficient documentation

## 2023-11-06 DIAGNOSIS — R3129 Other microscopic hematuria: Secondary | ICD-10-CM

## 2023-11-06 DIAGNOSIS — K635 Polyp of colon: Secondary | ICD-10-CM | POA: Insufficient documentation

## 2023-11-06 DIAGNOSIS — C4440 Unspecified malignant neoplasm of skin of scalp and neck: Secondary | ICD-10-CM | POA: Insufficient documentation

## 2023-11-06 DIAGNOSIS — R2689 Other abnormalities of gait and mobility: Secondary | ICD-10-CM | POA: Insufficient documentation

## 2023-11-06 DIAGNOSIS — R54 Age-related physical debility: Secondary | ICD-10-CM | POA: Insufficient documentation

## 2023-11-06 DIAGNOSIS — D485 Neoplasm of uncertain behavior of skin: Secondary | ICD-10-CM | POA: Insufficient documentation

## 2023-11-06 DIAGNOSIS — E43 Unspecified severe protein-calorie malnutrition: Secondary | ICD-10-CM | POA: Insufficient documentation

## 2023-11-06 DIAGNOSIS — N401 Enlarged prostate with lower urinary tract symptoms: Secondary | ICD-10-CM | POA: Diagnosis not present

## 2023-11-06 DIAGNOSIS — R351 Nocturia: Secondary | ICD-10-CM | POA: Diagnosis not present

## 2023-11-06 DIAGNOSIS — Z7729 Contact with and (suspected ) exposure to other hazardous substances: Secondary | ICD-10-CM | POA: Insufficient documentation

## 2023-11-06 DIAGNOSIS — Z659 Problem related to unspecified psychosocial circumstances: Secondary | ICD-10-CM | POA: Insufficient documentation

## 2023-11-06 DIAGNOSIS — C4490 Unspecified malignant neoplasm of skin, unspecified: Secondary | ICD-10-CM | POA: Insufficient documentation

## 2023-11-06 DIAGNOSIS — Z741 Need for assistance with personal care: Secondary | ICD-10-CM | POA: Insufficient documentation

## 2023-11-06 DIAGNOSIS — Z961 Presence of intraocular lens: Secondary | ICD-10-CM | POA: Insufficient documentation

## 2023-11-06 DIAGNOSIS — L219 Seborrheic dermatitis, unspecified: Secondary | ICD-10-CM | POA: Insufficient documentation

## 2023-11-06 NOTE — Progress Notes (Signed)
 Name: Hayden Davis DOB: Feb 04, 1921 MRN: [id number]  History of Present Illness: Hayden Davis is a 89 y.o. male who presents today at [facility] . He is accompanied by his daughter Donny, who assists with providing history. Relevant History includes: 1. BPH with BOO & LUTS (nocturia). - 03/16/2019: Renal US  showed a prostate with intravesical extension. - Taking Proscar  (Finasteride ) 5 mg daily. 2. Recurrent hematuria. - Per Dr. Watt: He has a history of hematuria that is felt to have been secondary to prostatic bleeding. - 05/07/2019: Cystoscopy revealed large friable prostate.   At last visit with Dr. Watt on 11/07/2022: Doing well.  Today: He denies urinary urgency, frequency, nocturia, dysuria, gross hematuria, hesitancy, straining to void, or sensations of incomplete emptying.   Medications: Current Outpatient Medications  Medication Sig Dispense Refill   famotidine  (PEPCID ) 10 MG tablet Take 10 mg by mouth daily.     finasteride  (PROSCAR ) 5 MG tablet TAKE 1 TABLET EVERY DAY 90 tablet 3   levothyroxine  (SYNTHROID ) 100 MCG tablet Take 100 mcg by mouth daily.     No current facility-administered medications for this visit.    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 No pertinent family history. Social History   Socioeconomic History   Marital status: Married    Spouse name: Not on file   Number of children: Not on file   Years of education: Not on file   Highest education level: Not on file  Occupational History   Not on file  Tobacco Use   Smoking status: Never   Smokeless tobacco: Never  Vaping Use   Vaping status: Never Used  Substance and Sexual Activity   Alcohol use: Yes    Comment: occ   Drug use: No   Sexual activity: Not on file  Other Topics Concern   Not on file  Social History Narrative   Not on file   Social Drivers of Health   Financial Resource Strain: Low Risk  (09/03/2022)   Received from [facility]   Overall Financial Resource Strain (CARDIA)    Difficulty of Paying Living Expenses: Not hard at all  Food Insecurity: No Food Insecurity (09/03/2022)   Received from [facility]   Hunger Vital Sign    Within the past 12 months, you worried that your food would run out before  you got the money to buy more.: Never true    Within the past 12 months, the food you bought just didn't last and you didn't have money to get more.: Never true  Transportation Needs: No Transportation Needs (09/03/2022)   Received from [facility]   PRAPARE - Transportation    Lack of Transportation (Medical): No    Lack of Transportation (Non-Medical): No  Physical Activity: Insufficiently Active (09/03/2022)   Received from [facility]   Exercise Vital Sign    On average, how many days per week do you engage in moderate to strenuous exercise (like a brisk walk)?: 2  days    On average, how many minutes do you engage in exercise at this level?: 30 min  Stress: No Stress Concern Present (09/03/2022)   Received from [facility] - Occupational Stress Questionnaire    Feeling of Stress : Not at all  Social Connections: Moderately Integrated (09/03/2022)   Received from [facility]   Social Network    How would you rate your social network (family, work, friends)?: Adequate participation with social networks  Intimate Partner Violence: Not At Risk (09/03/2022)   Received from [facility]   HITS    Over the last 12 months how often did your partner physically hurt you?: Never    Over the last 12 months how often did your partner insult you or talk down to you?: Never    Over the last 12 months how often did your partner threaten you with physical harm?: Never    Over the last 12 months how often did your partner scream or curse at you?: Never    Review of Systems Constitutional: Patient denies any unintentional weight loss or change in strength lntegumentary: Patient denies any rashes or pruritus Cardiovascular: Patient denies chest pain or syncope Respiratory: Patient denies shortness of breath Gastrointestinal: Patient denies nausea, vomiting, constipation, or diarrhea  Musculoskeletal: Patient denies muscle cramps or weakness Neurologic: Patient denies convulsions or seizures Allergic/Immunologic: Patient denies recent allergic reaction(s) Hematologic/Lymphatic: Patient denies bleeding tendencies Endocrine: Patient denies heat/cold intolerance  GU: As per HPI.  OBJECTIVE Vitals:   11/06/23 1436  BP: 115/62  Pulse: 75   There is no height or weight on file to calculate BMI.  Physical Examination Constitutional: No obvious distress; patient is non-toxic appearing  Cardiovascular: No visible lower extremity edema.  Respiratory: The patient does not have audible wheezing/stridor; respirations do  not appear labored  Gastrointestinal: Abdomen non-distended Musculoskeletal: Normal ROM of UEs  Skin: No obvious rashes/open sores  Neurologic: CN 2-12 grossly intact Psychiatric: Answered questions appropriately with normal affect  Hematologic/Lymphatic/Immunologic: No obvious bruises or sites of spontaneous bleeding  Urine microscopy: 6-10 WBC/hpf, 11-30 RBC/hpf, few bacteria  ASSESSMENT BPH with urinary obstruction - Plan: Urinalysis, Routine w reflex microscopic  Microhematuria  Nocturia  He is doing well with no acute findings. Will continue Proscar ; refills are up to date. We agreed to plan for follow up in 1 year or sooner if needed. Patient verbalized understanding of and agreement with current plan. All questions were answered.  PLAN Advised the following: 1. Continue Proscar  (Finasteride ) 5 mg daily. 2. Return in about 1 year (around 11/05/2024) for BPH, with UA & PVR.  Orders Placed This Encounter  Procedures   Urinalysis, Routine w reflex microscopic    It has been explained that the patient is to follow regularly with their PCP in addition to all other providers involved  in their care and to follow instructions provided by these respective offices. Patient advised to contact urology clinic if any urologic-pertaining questions, concerns, new symptoms or problems arise in the interim period.  There are no Patient Instructions on file for this visit.  Electronically signed by:  Lauraine JAYSON Oz, FNP   11/06/23    2:51 PM

## 2023-11-07 LAB — MICROSCOPIC EXAMINATION

## 2023-11-07 LAB — URINALYSIS, ROUTINE W REFLEX MICROSCOPIC
Bilirubin, UA: NEGATIVE
Glucose, UA: NEGATIVE
Ketones, UA: NEGATIVE
Nitrite, UA: NEGATIVE
Protein,UA: NEGATIVE
Specific Gravity, UA: 1.02 (ref 1.005–1.030)
Urobilinogen, Ur: 0.2 mg/dL (ref 0.2–1.0)
pH, UA: 6 (ref 5.0–7.5)

## 2024-01-14 ENCOUNTER — Other Ambulatory Visit: Payer: Self-pay | Admitting: Urology

## 2024-01-14 DIAGNOSIS — N401 Enlarged prostate with lower urinary tract symptoms: Secondary | ICD-10-CM

## 2024-03-29 ENCOUNTER — Other Ambulatory Visit: Payer: Self-pay

## 2024-03-29 DIAGNOSIS — N138 Other obstructive and reflux uropathy: Secondary | ICD-10-CM

## 2024-03-29 NOTE — Telephone Encounter (Signed)
 Patient's Rx was denied finasteride  (PROSCAR ) 5 MG tablet refill.  Daughter is asking if patient needs to continue taking.  Please advise.  Call:  [phone]

## 2024-03-29 NOTE — Telephone Encounter (Signed)
 Pt daughter called to receive a Rx refill request to MD, Daughter made aware that due to no DPR on file for her all I can let her know is a message will be sent to the provider daughter states she has a POA for pt pt daughter advised to bring in documentation pt daughter voiced her understanding

## 2024-05-23 DEATH — deceased

## 2024-11-08 ENCOUNTER — Ambulatory Visit: Admitting: Urology
